# Patient Record
Sex: Female | Born: 1940 | Race: White | Hispanic: No | Marital: Single | Smoking: Never smoker
Health system: Southern US, Community
[De-identification: ages and names within clinical notes are randomized; demographics above are authoritative.]

## PROBLEM LIST (undated history)

## (undated) DIAGNOSIS — F79 Unspecified intellectual disabilities: Secondary | ICD-10-CM

## (undated) DIAGNOSIS — I82409 Acute embolism and thrombosis of unspecified deep veins of unspecified lower extremity: Secondary | ICD-10-CM

## (undated) DIAGNOSIS — E119 Type 2 diabetes mellitus without complications: Secondary | ICD-10-CM

## (undated) DIAGNOSIS — E7800 Pure hypercholesterolemia, unspecified: Secondary | ICD-10-CM

## (undated) DIAGNOSIS — E785 Hyperlipidemia, unspecified: Secondary | ICD-10-CM

## (undated) DIAGNOSIS — I10 Essential (primary) hypertension: Secondary | ICD-10-CM

## (undated) HISTORY — PX: ABDOMINAL HYSTERECTOMY: SHX81

## (undated) HISTORY — PX: FRACTURE SURGERY: SHX138

---

## 1997-06-25 ENCOUNTER — Encounter: Admission: RE | Admit: 1997-06-25 | Discharge: 1997-06-25 | Payer: Self-pay | Admitting: Family Medicine

## 1998-12-13 ENCOUNTER — Encounter: Admission: RE | Admit: 1998-12-13 | Discharge: 1998-12-13 | Payer: Self-pay | Admitting: Family Medicine

## 2000-01-20 ENCOUNTER — Encounter: Admission: RE | Admit: 2000-01-20 | Discharge: 2000-01-20 | Payer: Self-pay | Admitting: Family Medicine

## 2000-07-02 ENCOUNTER — Encounter: Admission: RE | Admit: 2000-07-02 | Discharge: 2000-07-02 | Payer: Self-pay | Admitting: Family Medicine

## 2000-09-07 ENCOUNTER — Encounter: Admission: RE | Admit: 2000-09-07 | Discharge: 2000-09-07 | Payer: Self-pay | Admitting: Sports Medicine

## 2000-11-22 ENCOUNTER — Encounter: Admission: RE | Admit: 2000-11-22 | Discharge: 2000-11-22 | Payer: Self-pay | Admitting: Family Medicine

## 2001-03-18 ENCOUNTER — Encounter (INDEPENDENT_AMBULATORY_CARE_PROVIDER_SITE_OTHER): Payer: Self-pay | Admitting: *Deleted

## 2001-03-18 LAB — CONVERTED CEMR LAB

## 2001-04-13 ENCOUNTER — Encounter: Admission: RE | Admit: 2001-04-13 | Discharge: 2001-04-13 | Payer: Self-pay | Admitting: Family Medicine

## 2001-09-05 ENCOUNTER — Encounter: Admission: RE | Admit: 2001-09-05 | Discharge: 2001-09-05 | Payer: Self-pay | Admitting: Family Medicine

## 2002-01-26 ENCOUNTER — Encounter: Admission: RE | Admit: 2002-01-26 | Discharge: 2002-01-26 | Payer: Self-pay | Admitting: Family Medicine

## 2002-01-27 ENCOUNTER — Encounter: Admission: RE | Admit: 2002-01-27 | Discharge: 2002-01-27 | Payer: Self-pay | Admitting: Family Medicine

## 2002-02-15 ENCOUNTER — Encounter: Admission: RE | Admit: 2002-02-15 | Discharge: 2002-02-15 | Payer: Self-pay | Admitting: Family Medicine

## 2002-09-04 ENCOUNTER — Encounter: Admission: RE | Admit: 2002-09-04 | Discharge: 2002-09-04 | Payer: Self-pay | Admitting: Family Medicine

## 2003-01-23 ENCOUNTER — Encounter: Admission: RE | Admit: 2003-01-23 | Discharge: 2003-01-23 | Payer: Self-pay | Admitting: Sports Medicine

## 2003-03-02 ENCOUNTER — Encounter: Admission: RE | Admit: 2003-03-02 | Discharge: 2003-03-02 | Payer: Self-pay | Admitting: Sports Medicine

## 2004-02-13 ENCOUNTER — Ambulatory Visit: Payer: Self-pay | Admitting: Family Medicine

## 2005-02-11 ENCOUNTER — Ambulatory Visit: Payer: Self-pay | Admitting: Sports Medicine

## 2006-01-20 ENCOUNTER — Ambulatory Visit: Payer: Self-pay | Admitting: Family Medicine

## 2006-05-13 DIAGNOSIS — I10 Essential (primary) hypertension: Secondary | ICD-10-CM

## 2006-05-13 DIAGNOSIS — F79 Unspecified intellectual disabilities: Secondary | ICD-10-CM | POA: Insufficient documentation

## 2006-05-13 DIAGNOSIS — E119 Type 2 diabetes mellitus without complications: Secondary | ICD-10-CM

## 2006-05-14 ENCOUNTER — Encounter (INDEPENDENT_AMBULATORY_CARE_PROVIDER_SITE_OTHER): Payer: Self-pay | Admitting: *Deleted

## 2007-02-08 ENCOUNTER — Ambulatory Visit: Payer: Self-pay | Admitting: Family Medicine

## 2007-12-29 ENCOUNTER — Ambulatory Visit: Payer: Self-pay | Admitting: Family Medicine

## 2007-12-29 LAB — CONVERTED CEMR LAB: Hgb A1c MFr Bld: 5.5 %

## 2008-03-26 ENCOUNTER — Telehealth (INDEPENDENT_AMBULATORY_CARE_PROVIDER_SITE_OTHER): Payer: Self-pay | Admitting: *Deleted

## 2008-06-20 ENCOUNTER — Ambulatory Visit: Payer: Self-pay | Admitting: Family Medicine

## 2008-06-20 LAB — CONVERTED CEMR LAB: Hgb A1c MFr Bld: 5.5 %

## 2009-02-20 ENCOUNTER — Ambulatory Visit: Payer: Self-pay | Admitting: Family Medicine

## 2009-02-20 ENCOUNTER — Encounter: Payer: Self-pay | Admitting: Sports Medicine

## 2009-02-20 DIAGNOSIS — B351 Tinea unguium: Secondary | ICD-10-CM

## 2009-02-20 LAB — CONVERTED CEMR LAB
ALT: 12 U/L (ref 0–35)
AST: 17 U/L (ref 0–37)
Albumin: 4.4 g/dL (ref 3.5–5.2)
Alkaline Phosphatase: 87 units/L (ref 39–117)
BUN: 16 mg/dL (ref 6–23)
CO2: 26 meq/L (ref 19–32)
Calcium: 9.8 mg/dL (ref 8.4–10.5)
Chloride: 103 meq/L (ref 96–112)
Creatinine, Ser: 0.78 mg/dL (ref 0.40–1.20)
Glucose, Bld: 123 mg/dL — ABNORMAL HIGH (ref 70–99)
Hgb A1c MFr Bld: 6 %
Potassium: 4.2 meq/L (ref 3.5–5.3)
Sodium: 143 meq/L (ref 135–145)
Total Bilirubin: 0.7 mg/dL (ref 0.3–1.2)
Total Protein: 7.3 g/dL (ref 6.0–8.3)

## 2009-02-22 ENCOUNTER — Telehealth: Payer: Self-pay | Admitting: Sports Medicine

## 2009-05-30 ENCOUNTER — Encounter: Payer: Self-pay | Admitting: Sports Medicine

## 2009-11-21 ENCOUNTER — Telehealth: Payer: Self-pay | Admitting: Sports Medicine

## 2009-11-28 ENCOUNTER — Ambulatory Visit: Payer: Self-pay | Admitting: Family Medicine

## 2009-12-03 ENCOUNTER — Encounter: Payer: Self-pay | Admitting: Sports Medicine

## 2010-01-08 ENCOUNTER — Encounter: Payer: Self-pay | Admitting: Sports Medicine

## 2010-04-15 NOTE — Progress Notes (Signed)
 Summary: Rx Req  Phone Note Refill Request Call back at Home Phone [phone] Message from:  Sister-Laura  Refills Requested: Medication #1:  HYDROCHLOROTHIAZIDE  25 MG TABS Take 1 tablet by mouth once a day  Medication #2:  GLUCOTROL  XL 5 MG TB24 Take 1 tablet by mouth once a day PT USES [facility] [place].  Initial call taken by: Madelin Daring,  February 22, 2009 3:50 PM  Follow-up for Phone Call        to pcp Follow-up by: Ginnie Mau RN,  February 22, 2009 4:07 PM  Additional Follow-up for Phone Call Additional follow up Details #1::        Refilled. Additional Follow-up by: Debby Petties MD,  February 24, 2009 8:51 AM    Prescriptions: GLUCOTROL  XL 5 MG TB24 (GLIPIZIDE ) Take 1 tablet by mouth once a day  #90 x 4   Entered and Authorized by:   Debby Petties MD   Signed by:   Debby Petties MD on 02/24/2009   Method used:   Electronically to        [facility] (retail)       [address]       [place], [place]  [address]       Ph: [phone]       Fax: [phone]   RxID:   [id number] HYDROCHLOROTHIAZIDE  25 MG TABS (HYDROCHLOROTHIAZIDE ) Take 1 tablet by mouth once a day  #90 x 4   Entered and Authorized by:   Debby Petties MD   Signed by:   Debby Petties MD on 02/24/2009   Method used:   Electronically to        [facility] (retail)       [address]       [place], [place]  [address]       Ph: [phone]       Fax: [phone]   RxID:   [id number]

## 2010-04-15 NOTE — Assessment & Plan Note (Signed)
 Summary: cpe/flu shot,tcb   Vital Signs:  Patient profile:   70 year old female Weight:      203 pounds Pulse rate:   85 / minute BP sitting:   133 / 76  Vitals Entered By: Harlene Carte CMA (February 20, 2009 3:01 PM) CC: cpe and flu Is Patient Diabetic? Yes Did you bring your meter with you today? No Pain Assessment Patient in pain? no        Primary Care Provider:  Debby Petties MD  CC:  cpe and flu.  History of Present Illness: 48f MR here for CPE.  Doing well, lives with brother.  Very well taken care of, walks short distances.  No falls but afraid of falling.  Family worried about toenail fungus.  No other worries.  Habits & Providers  Alcohol-Tobacco-Diet     Tobacco Status: never  Current Medications (verified): 1)  Altace  10 Mg Caps (Ramipril ) .... Take 1 Capsule By Mouth Twice A Day 2)  Aspirin  Childrens 81 Mg Chew (Aspirin ) .... Take 1 Tablet By Mouth Once A Day 3)  Calcium Citrate-Vitamin D  315-200 Mg-Unit Tabs (Calcium Citrate-Vitamin D ) .... Take 4)  Glucotrol  Xl 5 Mg Tb24 (Glipizide ) .... Take 1 Tablet By Mouth Once A Day 5)  Hydrochlorothiazide  25 Mg Tabs (Hydrochlorothiazide ) .... Take 1 Tablet By Mouth Once A Day 6)  Terbinafine Hcl 250 Mg Tabs (Terbinafine Hcl) .... One Tab By Mouth Daily X 12 Weeks  Allergies (verified): No Known Drug Allergies  Past History:  Past Medical History: Last updated: 12/29/2007 global speech and developmental delay  Past Surgical History: Last updated: 05/13/2006 Hysterectomy - Partial - 09/14/1978, tooth extraction - 09/13/1988  Family History: Last updated: 05/13/2006 DM- bro, grandfather, M-HTN, CAD, Osteoporosis  Social History: Last updated: 05/13/2006 lives with brother, Levander Generous [phone], and sister-in-law Jadin Kagel as well as with her mother.  Brother and sister-in-law are primary caretakers of both pt and her mother.  Brother with medical POA.  No smoking, etoh.  Pt able to communicate with  family but speech is unintelligible. Dresses herself, needs help with toileting. Ambulates at home w/ assistance.  Review of Systems       Unable, non-verbal.  Physical Exam  General:  Well-developed,well-nourished,in no acute distress; alert,appropriate and cooperative throughout examination Head:  Normocephalic and atraumatic without obvious abnormalities.  Eyes:  No corneal or conjunctival inflammation noted. EOMI. Perrla.  Ears:  External ear exam shows no significant lesions or deformities.  Otoscopic examination reveals canals occluded by impacted cerumen. Hearing is grossly normal bilaterally. Nose:  External nasal examination shows no deformity or inflammation. Nasal mucosa are pink and moist without lesions or exudates. Mouth:  Oral mucosa and oropharynx without lesions or exudates.  Edentulous. Neck:  No deformities, masses, or tenderness noted. Chest Wall:  No deformities, masses, or tenderness noted. Lungs:  Normal respiratory effort, chest expands symmetrically. Lungs are clear to auscultation, no crackles or wheezes. Heart:  Normal rate and regular rhythm. S1 and S2 normal without gallop, murmur, click, rub or other extra sounds. Abdomen:  Bowel sounds positive,abdomen soft and non-tender without masses, organomegaly or hernias noted. Pulses:  R and L carotid,radial,femoral,dorsalis pedis and posterior tibial pulses are full and equal bilaterally Extremities:  No clubbing, cyanosis, edema, or deformity noted with normal full range of motion of all joints.  Toenails thickened, yellowish. Skin:  Intact without suspicious lesions or rashes Additional Exam:  2 large 1cm impactions of earwax removed from left EAM.  Diabetes Management Exam:    Foot Exam (with socks and/or shoes not present):       Sensory-Monofilament:          Left foot: normal          Right foot: normal   Impression & Recommendations:  Problem # 1:  ONYCHOMYCOSIS, TOENAILS (ICD-110.1) Assessment  New Check LFTs, then Lamisil x 12 weeks.  Her updated medication list for this problem includes:    Terbinafine Hcl 250 Mg Tabs (Terbinafine hcl) ..... One tab by mouth daily x 12 weeks  Orders: [facility]- Est  Level 4 ([address]214) Comp Met-[facility] (19946-77099)  Problem # 2:  MENTAL RETARDATION (ICD-319) Assessment: Unchanged  Problem # 3:  HYPERTENSION, BENIGN SYSTEMIC (ICD-401.1) Assessment: Unchanged Ok, no changes needed at this time.  Her updated medication list for this problem includes:    Altace  10 Mg Caps (Ramipril ) .SABRA... Take 1 capsule by mouth twice a day    Hydrochlorothiazide  25 Mg Tabs (Hydrochlorothiazide ) .SABRA... Take 1 tablet by mouth once a day  Orders: [facility]- Est  Level 4 ([address]214) Comp Met-[facility] (19946-77099)  Problem # 4:  DIABETES MELLITUS II, UNCOMPLICATED (ICD-250.00) Assessment: Unchanged A1c 6%. Continue glucotrol , no changes, foot exam normal.  Her updated medication list for this problem includes:    Altace  10 Mg Caps (Ramipril ) .SABRA... Take 1 capsule by mouth twice a day    Aspirin  Childrens 81 Mg Chew (Aspirin ) .SABRA... Take 1 tablet by mouth once a day    Glucotrol  Xl 5 Mg Tb24 (Glipizide ) .SABRA... Take 1 tablet by mouth once a day  Orders: A1C-[facility] (16963) [facility]- Est  Level 4 (99214) Comp Met-[facility] ([id number])  Problem # 5:  Preventive Health Care (ICD-V70.0) Assessment: Unchanged CPE normal.  Family still refuses breast exam, PAP, mammograms.  RTC 1 year.  Gave flu shot.  Complete Medication List: 1)  Altace  10 Mg Caps (Ramipril ) .... Take 1 capsule by mouth twice a day 2)  Aspirin  Childrens 81 Mg Chew (Aspirin ) .... Take 1 tablet by mouth once a day 3)  Calcium Citrate-vitamin D  315-200 Mg-unit Tabs (Calcium citrate-vitamin d ) .... Take 4)  Glucotrol  Xl 5 Mg Tb24 (Glipizide ) .... Take 1 tablet by mouth once a day 5)  Hydrochlorothiazide  25 Mg Tabs (Hydrochlorothiazide ) .... Take 1 tablet by mouth once a day 6)  Terbinafine Hcl 250 Mg Tabs (Terbinafine hcl) .... One  tab by mouth daily x 12 weeks  Other Orders: Influenza Vaccine MCR (99974)  Patient Instructions: 1)  Great to meet ya'll today, 2)  Flu shot 3)  Already had tetanus and pneumonia shots. 4)  Check liver function. 5)  Start Lamisil (terbinafine) 250mg  daily x 12 weeks for toenail fungus. 6)  Flush ears 7)  Come back to see me in a year or sooner if any problems. 8)  -Dr. ONEIDA. Prescriptions: TERBINAFINE HCL 250 MG TABS (TERBINAFINE HCL) One tab by mouth daily x 12 weeks  #84 x 0   Entered and Authorized by:   Debby Petties MD   Signed by:   Debby Petties MD on 02/20/2009   Method used:   Electronically to        [facility] (retail)       [address]       [place], [place]  [address]       Ph: [phone]       Fax: [phone]   RxID:   [id number]  Laboratory Results   Blood Tests   Date/Time Received: February 20, 2009 2:52 PM  Date/Time Reported: February 20, 2009 3:18 PM   HGBA1C: 6.0%   (Normal Range: Non-Diabetic - 3-6%   Control Diabetic - 6-8%)  Comments: ...............test performed by......SABRABonnie A. Jordan, MLS (ASCP)cm      Last TD:  Td (06/20/2008 3:42:30 PM) TD Next Due:  10 yr    Diabetic Foot Exam Foot Inspection Is there a history of a foot ulcer?              No Is there a foot ulcer now?              No Is there swelling or an abnormal foot shape?          No Are the toenails long?                No Are the toenails thick?                No Are the toenails ingrown?              No Is there heavy callous build-up?              No Is there pain in the calf muscle (Intermittent claudication) when walking?    NoIs there a claw toe deformity?              No Is there elevated skin temperature?            No Is there limited ankle dorsiflexion?            No Is there foot or ankle muscle weakness?            No  Diabetic Foot Care Education Patient educated on appropriate care of diabetic  feet.  Pulse Check          Right Foot          Left Foot Posterior Tibial:        normal            normal Dorsalis Pedis:        normal            normal  High Risk Feet? No Set Next Diabetic Foot Exam here: 02/20/2010   10-g (5.07) Semmes-Weinstein Monofilament Test Performed by: Debby Petties MD          Right Foot          Left Foot Visual Inspection               Test Control      normal         normal Site 1         normal         normal Site 2         normal         normal Site 3         normal         normal Site 4         normal         normal Site 5         normal         normal Site 6         normal         normal Site 7         normal  normal Site 8         normal         normal Site 9         normal         normal Site 10         normal         normal  Impression      normal         normal   Prevention & Chronic Care Immunizations   Influenza vaccine: Fluvax MCR  (02/20/2009)   Influenza vaccine due: Not Indicated    Tetanus booster: 06/20/2008: Td   Tetanus booster due: 06/21/2018    Pneumococcal vaccine: Done.  (01/15/2000)   Pneumococcal vaccine due: None    H. zoster vaccine: Not documented  Colorectal Screening   Hemoccult: Done.  (03/18/2001)   Hemoccult due: Refused  (06/20/2008)    Colonoscopy: Not documented   Colonoscopy due: Refused  (06/20/2008)  Other Screening   Pap smear: Done.  (03/18/2001)   Pap smear due: Not Indicated    Mammogram: Done.  (03/16/2001)   Mammogram due: Refused  (06/20/2008)    DXA bone density scan: Not documented   DXA scan due: Refused  (06/20/2008)    Smoking status: never  (02/20/2009)  Diabetes Mellitus   HgbA1C: 6.0  (02/20/2009)   Hemoglobin A1C due: 03/30/2008    Eye exam: Not documented    Foot exam: yes  (02/20/2009)   Foot exam action/deferral: Do today   High risk foot: No  (02/20/2009)   Foot care education: Done  (02/20/2009)   Foot exam due: 02/20/2010    Urine  microalbumin/creatinine ratio: Not documented    Diabetes flowsheet reviewed?: Yes   Progress toward A1C goal: At goal  Lipids   Total Cholesterol: Not documented   LDL: Not documented   LDL Direct: Not documented   HDL: Not documented   Triglycerides: Not documented  Hypertension   Last Blood Pressure: 133 / 76  (02/20/2009)   Serum creatinine: Not documented   Serum potassium Not documented CMP ordered     Hypertension flowsheet reviewed?: Yes   Progress toward BP goal: At goal  Self-Management Support :   Personal Goals (by the next clinic visit) :     Personal A1C goal: 8  (02/20/2009)     Personal blood pressure goal: 130/80  (02/20/2009)     Personal LDL goal: 100  (02/20/2009)    Diabetes self-management support: Written self-care plan  (02/20/2009)   Diabetes care plan printed    Hypertension self-management support: Written self-care plan  (02/20/2009)   Hypertension self-care plan printed.   Nursing Instructions: Diabetic foot exam today    Immunizations Administered:  Influenza Vaccine # 1:    Vaccine Type: Fluvax MCR    Site: right deltoid    Mfr: GlaxoSmithKline    Dose: 0.5 ml    Route: IM    Given by: Harlene Carte CMA    Exp. Date: 09/12/2009    Lot #: [id number]    VIS given: 8.10.10  Flu Vaccine Consent Questions:    Do you have a history of severe allergic reactions to this vaccine? no    Any prior history of allergic reactions to egg and/or gelatin? no    Do you have a sensitivity to the preservative Thimersol? no    Do you have a past history of Guillan-Barre Syndrome? no    Do you currently have an acute febrile illness? no  Have you ever had a severe reaction to latex? no    Vaccine information given and explained to patient? yes    Are you currently pregnant? no

## 2010-04-15 NOTE — Assessment & Plan Note (Signed)
Summary: discuss living options/[place]   Vital Signs:  Patient profile:   70 year old female Temp:     98.2 degrees F Pulse rate:   87 / minute BP sitting:   125 / 78  Vitals Entered By: Jone Baseman CMA (November 28, 2009 4:59 PM) CC: discuss assisted living   Primary Care Provider:  Rodney Langton MD  CC:  discuss assisted living.  History of Present Illness: 70 yo female MR living with siblings.  Siblings are getting older, sister has had a back surgery, can no longer care for pt.  In to discuss placement options.  See geriatric assessment for further details.  I spent >30 minutes with this patient.  Current Medications (verified): 1)  Altace 10 Mg Caps (Ramipril) .... Take 1 Capsule By Mouth Twice A Day 2)  Aspirin Childrens 81 Mg Chew (Aspirin) .... Take 1 Tablet By Mouth Once A Day 3)  Calcium Citrate-Vitamin D 315-200 Mg-Unit Tabs (Calcium Citrate-Vitamin D) .... Take 4)  Glucotrol Xl 5 Mg Tb24 (Glipizide) .... Take 1 Tablet By Mouth Once A Day 5)  Hydrochlorothiazide 25 Mg Tabs (Hydrochlorothiazide) .... Take 1 Tablet By Mouth Once A Day 6)  Terbinafine Hcl 250 Mg Tabs (Terbinafine Hcl) .... One Tab By Mouth Daily X 12 Weeks  Allergies (verified): No Known Drug Allergies  Review of Systems       See HPI  Physical Exam  General:  Well-developed,well-nourished,in no acute distress; alert,appropriate and cooperative throughout examination.  Non-verbal. Lungs:  Normal respiratory effort, chest expands symmetrically. Lungs are clear to auscultation, no crackles or wheezes. Heart:  Normal rate and regular rhythm. S1 and S2 normal without gallop, murmur, click, rub or other extra sounds. Abdomen:  Bowel sounds positive,abdomen soft and non-tender without masses, organomegaly or hernias noted. Extremities:  No clubbing, cyanosis, edema, or deformity noted with normal full range of motion of all joints.     Impression & Recommendations:  Problem # 1:  MENTAL  RETARDATION (ICD-319) Assessment Unchanged Family is very appropriate, understandable that they can no longer care for pt.   Discussed with Dr. Sheffield Slider.   Pt completely dependent in ADLs and IADLs and non-verbal.   Ideal location will be nursing home.   Advised to visit several nursing homes, talk with social worker of the one they choose and they will send me the [facility] form to fill out.  Orders: [facility]- Est  Level 4 ([address])  Problem # 2:  HYPERTENSION, BENIGN SYSTEMIC (ICD-401.1) Assessment: Improved BP well controlled.  Her updated medication list for this problem includes:    Altace 10 Mg Caps (Ramipril) .Marland Kitchen... Take 1 capsule by mouth twice a day    Hydrochlorothiazide 25 Mg Tabs (Hydrochlorothiazide) .Marland Kitchen... Take 1 tablet by mouth once a day  Orders: [facility]- Est  Level 4 ([address]214)  Complete Medication List: 1)  Altace 10 Mg Caps (Ramipril) .... Take 1 capsule by mouth twice a day 2)  Aspirin Childrens 81 Mg Chew (Aspirin) .... Take 1 tablet by mouth once a day 3)  Calcium Citrate-vitamin D 315-200 Mg-unit Tabs (Calcium citrate-vitamin d) .... Take 4)  Glucotrol Xl 5 Mg Tb24 (Glipizide) .... Take 1 tablet by mouth once a day 5)  Hydrochlorothiazide 25 Mg Tabs (Hydrochlorothiazide) .... Take 1 tablet by mouth once a day 6)  Terbinafine Hcl 250 Mg Tabs (Terbinafine hcl) .... One tab by mouth daily x 12 weeks   Geriatric Assessment:  Activities of Daily Living:    Bathing-dependent  Dressing-dependent    Eating-independent    Toileting-assisted    Transferring-dependent    Continence-assisted  Instrumental Activities of Daily Living:    Transportation-dependent    Meal/Food Preparation-dependent    Shopping Errands-dependent    Housekeeping/Chores-dependent    Money Management/Finances-dependent    Medication Management-dependent    Ability to Use Telephone-dependent    Laundry-dependent

## 2010-04-15 NOTE — Letter (Signed)
Summary: Generic Letter  [facility]  [address]   [place], [place] [address]   Phone: [phone]  Fax: [phone]    05/30/2009  Crystal Robinson [address] [place], [place]  [address]  Dear Crystal Robinson,   You are overdue for some preventive medical tests.  Please make an appt to see me as soon as is convenient for you.     Sincerely,   Rodney Langton MD

## 2010-04-15 NOTE — Miscellaneous (Signed)
Summary: FL2  Patients brother dropped off FL-2 to be filled out.  Please mail to [facility]. Bradly Bienenstock  December 03, 2009 3:31 PM completed form to pcp chart box to add details & sign.Golden Circle RN  December 03, 2009 3:44 PM  Signed and in to-do box. Rodney Langton MD  December 04, 2009 9:10 AM

## 2010-04-15 NOTE — Miscellaneous (Signed)
Summary: FL2  pts brother dropped off form to be completed, gave to Terese Door for any clinical completion.  ERIN ODELL 01/08/10 3:53 PM    FL2 form completed and placed in Dr. Lucienne Minks box for signature. Terese Door  January 08, 2010 4:01 PM     Done and in  to-do box. Rodney Langton MD  January 09, 2010 10:19 AM

## 2010-04-15 NOTE — Progress Notes (Signed)
Summary: phn msg  Phone Note Call from Patient Call back at [phone]   Caller: brother- Ray Call For: Rodney Langton MD Summary of Call: [name], Crystal Robinson's brother would like for you to call him to discuss long term care for her.   Initial call taken by: Britta Mccreedy mcgregor  Follow-up for Phone Call        What specifit questions do they have? Follow-up by: Rodney Langton MD,  November 22, 2009 11:13 AM  Additional Follow-up for Phone Call Additional follow up Details #1::        pt is becoming more difficult to handle (they are getting older) - wants to know if doc could recommend a place for her to go to.  also was told that paperwork would need to be filled out for this, not sure if paperwork needs to be filled out before he looks for a place. Additional Follow-up by: De Nurse,  November 25, 2009 12:17 PM    Additional Follow-up for Phone Call Additional follow up Details #2::    This would be good to discuss in person to see if she would be ALF vs SNF status.  Will talk to them in the office visit re: ADLs and possibly even have a PT eval to determine functional status.  Have her come see me WITH necessary family PRESENT at visit.  May double, triple book, etc. Follow-up by: Rodney Langton MD,  November 25, 2009 2:15 PM  Additional Follow-up for Phone Call Additional follow up Details #3:: Details for Additional Follow-up Action Taken: her brother will have her here tomorrow to see Dr. Karie Schwalbe Additional Follow-up by: Golden Circle RN,  November 25, 2009 2:18 PM

## 2011-05-17 ENCOUNTER — Other Ambulatory Visit: Payer: Self-pay | Admitting: Sports Medicine

## 2011-05-17 NOTE — Telephone Encounter (Signed)
Refill request

## 2011-06-01 ENCOUNTER — Encounter: Payer: Self-pay | Admitting: Family Medicine

## 2011-06-24 ENCOUNTER — Telehealth: Payer: Self-pay | Admitting: Family Medicine

## 2011-06-24 ENCOUNTER — Encounter: Payer: Self-pay | Admitting: Family Medicine

## 2011-06-24 MED ORDER — HYDROCHLOROTHIAZIDE 25 MG PO TABS: 25.0000 mg | ORAL_TABLET | Freq: Every day | ORAL | Status: DC

## 2011-06-24 MED ORDER — GLIPIZIDE ER 5 MG PO TB24: 5.0000 mg | ORAL_TABLET | Freq: Every day | ORAL | Status: DC

## 2011-06-24 NOTE — Telephone Encounter (Signed)
Sister-in-law called to say that Ms. Armato was not able to keep appt today due to stomach virus and heavy diarrhea.  Will contact office to reschd later.  Need refills on her Glypizide and her Hctz sent to [facility] on [facility].

## 2011-07-01 ENCOUNTER — Ambulatory Visit (INDEPENDENT_AMBULATORY_CARE_PROVIDER_SITE_OTHER): Payer: Medicare Other | Admitting: Family Medicine

## 2011-07-01 ENCOUNTER — Encounter: Payer: Self-pay | Admitting: Family Medicine

## 2011-07-01 VITALS — BP 133/77 | HR 89 | Temp 97.5°F | Wt 197.0 lb

## 2011-07-01 DIAGNOSIS — F79 Unspecified intellectual disabilities: Secondary | ICD-10-CM

## 2011-07-01 DIAGNOSIS — E119 Type 2 diabetes mellitus without complications: Secondary | ICD-10-CM

## 2011-07-01 DIAGNOSIS — I10 Essential (primary) hypertension: Secondary | ICD-10-CM

## 2011-07-01 LAB — POCT GLYCOSYLATED HEMOGLOBIN (HGB A1C): Hemoglobin A1C: 5.3

## 2011-07-01 MED ORDER — RAMIPRIL 10 MG PO CAPS: 10.0000 mg | ORAL_CAPSULE | Freq: Every day | ORAL | Status: DC

## 2011-07-01 MED ORDER — HYDROCHLOROTHIAZIDE 25 MG PO TABS: 25.0000 mg | ORAL_TABLET | Freq: Every day | ORAL | Status: DC

## 2011-07-01 MED ORDER — GLIPIZIDE ER 5 MG PO TB24: 5.0000 mg | ORAL_TABLET | Freq: Every day | ORAL | Status: DC

## 2011-07-01 NOTE — Patient Instructions (Signed)
We are decreasing to 10 mg a day of Ramipril.   Check her blood pressure in about a week and let the nurse know what it is.   It should be less than 130/90.  Come back and see me in 3 months.

## 2011-07-02 LAB — CBC
MCH: 31.3 pg (ref 26.0–34.0)
MCHC: 33.4 g/dL (ref 30.0–36.0)
Platelets: 319 10*3/uL (ref 150–400)
RBC: 4.99 MIL/uL (ref 3.87–5.11)
RDW: 13.3 % (ref 11.5–15.5)

## 2011-07-02 LAB — COMPREHENSIVE METABOLIC PANEL
ALT: 17 U/L (ref 0–35)
Albumin: 4.1 g/dL (ref 3.5–5.2)
Alkaline Phosphatase: 78 U/L (ref 39–117)
Glucose, Bld: 160 mg/dL — ABNORMAL HIGH (ref 70–99)
Potassium: 4.3 mEq/L (ref 3.5–5.3)
Sodium: 142 mEq/L (ref 135–145)
Total Protein: 7 g/dL (ref 6.0–8.3)

## 2011-07-02 NOTE — Progress Notes (Signed)
  Subjective:    Patient ID: Crystal Robinson, female    DOB: [birth year], 71 y.o.   MRN: [id number]  HPI Patient here for complete physical exam. 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 She does live with her brother and his wife who are her caretakers. She has history of hypertension and diabetes mellitus x2.  Hypertension:  Long-term problem for this patient.  No adverse effects from medication.  Not checking it regularly.  No HA, CP, dizziness, shortness of breath, palpitations, or LE swelling.   BP Readings from Last 3 Encounters:  07/01/11 133/77  11/28/09 125/78  02/20/09 133/76    Diabetes:  Currently on Glipizide.    No adverse effects from medication.  No hypoglycemic events.  No paresthesia or peripheral nerve pain.  Measures blood sugars at home every:    Does not check at home. Lab Results  Component Value Date   HGBA1C 5.3 07/01/2011    The following portions of the patient's history were reviewed and updated as appropriate: allergies, current medications, past medical history, family and social history, and problem list.  Patient is a nonsmoker.   Review of Systems See HPI above for review of systems.       Objective:   Physical Exam Gen:  Alert, cooperative patient who appears stated age in no acute distress.  Vital signs reviewed. HEENT:  [place]/AT.  EOMI, PERRL.  MMM, tonsils non-erythematous, non-edematous.  External ears WNL, Bilateral TM's normal without retraction, redness or bulging.  Neck: No masses or thyromegaly or limitation in range of motion.  No cervical lymphadenopathy. Cardiac:  Regular rate and rhythm without murmur auscultated.  Good S1/S2. Pulm:  Clear to auscultation bilaterally with good air movement.  No wheezes or rales noted.   Abd:  Soft/nondistended/nontender.  Good bowel sounds throughout all four quadrants.  No masses noted.  Ext:  No edema.  She does hold left upper extremity and spasm which family states has been present since birth. Neuro:  Patient is nonverbal. She is pleasant and interactive. She does mobilize with wheelchair.       Assessment & Plan:

## 2011-07-02 NOTE — Assessment & Plan Note (Signed)
Stable. Family provide support at home.

## 2011-07-02 NOTE — Assessment & Plan Note (Signed)
Blood pressure at goal today. I do go be less than 1:30 systolic. Evidently she has been taking her Avapro on twice a day doses. Discuss with him that this is a once daily medication. Her basic decrease the medication to 10 mg a day. They're to recheck her blood pressure at home they're to call if they've noticed this value increasing. Also followup with [place] in about one month for followup blood pressure check.

## 2011-07-02 NOTE — Assessment & Plan Note (Signed)
Stable with A1c at goal. Continue current regimen of glipizide.

## 2011-07-03 ENCOUNTER — Telehealth: Payer: Self-pay | Admitting: Family Medicine

## 2011-07-03 MED ORDER — GLIPIZIDE ER 5 MG PO TB24: 5.0000 mg | ORAL_TABLET | Freq: Every day | ORAL | Status: DC

## 2011-07-03 NOTE — Telephone Encounter (Signed)
The refill for Glipizide did not make it to [facility] on [address].  Please resend it.

## 2011-07-03 NOTE — Telephone Encounter (Signed)
Done

## 2011-07-07 ENCOUNTER — Other Ambulatory Visit: Payer: Self-pay | Admitting: Family Medicine

## 2011-07-07 ENCOUNTER — Telehealth: Payer: Self-pay | Admitting: Family Medicine

## 2011-07-07 NOTE — Telephone Encounter (Signed)
Is having a problem getting the glipizide and the HCTZ from [facility] - they are telling her she cannot have it until sometime in May and she is almost out.  Needs our help to see what the problem is.

## 2011-07-07 NOTE — Telephone Encounter (Signed)
Spoke with [employer].  Patient received 90 day supply of HCTZ on 05/17/11 and is not due for another refill until 07/28/11 per insurance company.  Glipizide can be refilled on 07/18/11.  Patient is now taking Ramipril once a day and it was refilled x 2.  Vernona Rieger informed and states patient has enough meds to last until they can be refilled.  Will call our office back if she has any questions or problems.  Gaylene Brooks, RN

## 2011-08-19 ENCOUNTER — Telehealth: Payer: Self-pay | Admitting: Family Medicine

## 2011-08-19 NOTE — Telephone Encounter (Signed)
07/18/11 - 8:10 pm   124/77  08/05/11  -11:50 am   119/65  08/13/11  -10:35 am   132/72

## 2011-08-19 NOTE — Telephone Encounter (Signed)
Not sure what these are.  They look controlled however.  I am seeing her later this month, will discuss further then in person.

## 2011-09-02 ENCOUNTER — Encounter: Payer: Self-pay | Admitting: Family Medicine

## 2011-09-02 ENCOUNTER — Ambulatory Visit (INDEPENDENT_AMBULATORY_CARE_PROVIDER_SITE_OTHER): Payer: Medicare Other | Admitting: Family Medicine

## 2011-09-02 VITALS — BP 140/68 | HR 89 | Ht 65.0 in | Wt 200.0 lb

## 2011-09-02 DIAGNOSIS — F79 Unspecified intellectual disabilities: Secondary | ICD-10-CM

## 2011-09-02 DIAGNOSIS — I10 Essential (primary) hypertension: Secondary | ICD-10-CM

## 2011-09-02 MED ORDER — RAMIPRIL 10 MG PO CAPS: 10.0000 mg | ORAL_CAPSULE | Freq: Every day | ORAL | Status: DC

## 2011-09-02 NOTE — Patient Instructions (Addendum)
Crystal Robinson is doing very well today.  I'd like to see her back in about 6 months or so. Refill for Ramipri. New wheelchair prescription.

## 2011-09-04 NOTE — Assessment & Plan Note (Signed)
BLood pressure at goal today. Pleased that she is evidently doing much better on lower ACE than prior.   FU in 3 months for recheck.

## 2011-09-04 NOTE — Progress Notes (Signed)
  Subjective:    Patient ID: Crystal Robinson, female    DOB: [birth year], 71 y.o.   MRN: [id number]  HPI Crystal Robinson returns today for BP recheck.  We decreased her Ramipril, which she was taking twice daily.  BP is good today.  Caregivers note that Crystal Robinson is much more active now and has no further "slumped over" episodes in her wheelchair since decreasing this medication.  They had not really noticed before the extent of these episodes until they stopped.  She would slump over in her wheelchair and seem tired.  She would remain fully alert and awake during these episodes.  None for past 2 weeks, at least 1 daily prior to stopping Ramipril.   Review of Systems See HPI above for review of systems.       Objective:   Physical Exam Gen:  Alert, cooperative patient who appears stated age in no acute distress.  Vital signs reviewed. Cardiac:  Regular rate and rhythm without murmur auscultated.  Good S1/S2. Lungs:  Clear to auscultation bilaterally.  No wheezing, rales, rhonchi noted.    Ext:  No edema       Assessment & Plan:

## 2011-10-29 ENCOUNTER — Telehealth: Payer: Self-pay | Admitting: Family Medicine

## 2011-10-29 NOTE — Telephone Encounter (Signed)
[facility] is calling about a CMN for a wheelchair for this patient.  She said she faxed it over a month ago but I did not find it in Dr. Darius Bump or Paulina Fusi' boxes.  I have asked her to refax it and put it to Dr. Paulina Fusi attention.

## 2012-01-19 ENCOUNTER — Ambulatory Visit: Payer: Medicare Other | Admitting: Family Medicine

## 2012-01-22 ENCOUNTER — Encounter: Payer: Self-pay | Admitting: Family Medicine

## 2012-01-22 ENCOUNTER — Ambulatory Visit (INDEPENDENT_AMBULATORY_CARE_PROVIDER_SITE_OTHER): Payer: Medicare Other | Admitting: Family Medicine

## 2012-01-22 VITALS — BP 137/78 | HR 77 | Temp 97.7°F | Wt 204.0 lb

## 2012-01-22 DIAGNOSIS — I10 Essential (primary) hypertension: Secondary | ICD-10-CM

## 2012-01-22 DIAGNOSIS — E119 Type 2 diabetes mellitus without complications: Secondary | ICD-10-CM

## 2012-01-22 DIAGNOSIS — Z23 Encounter for immunization: Secondary | ICD-10-CM

## 2012-01-22 LAB — POCT GLYCOSYLATED HEMOGLOBIN (HGB A1C): Hemoglobin A1C: 6.1

## 2012-01-22 MED ORDER — GLIPIZIDE ER 5 MG PO TB24: 5.0000 mg | ORAL_TABLET | Freq: Every day | ORAL | Status: DC

## 2012-01-22 MED ORDER — HYDROCHLOROTHIAZIDE 25 MG PO TABS: 25.0000 mg | ORAL_TABLET | Freq: Every day | ORAL | Status: DC

## 2012-01-22 MED ORDER — RAMIPRIL 10 MG PO CAPS: 10.0000 mg | ORAL_CAPSULE | Freq: Every day | ORAL | Status: AC

## 2012-01-22 NOTE — Assessment & Plan Note (Signed)
BP well controlled on HCTZ and Ramipril.  Will see back in 6 months.  At that point will repeat BMET evaluate for creatinine (1 x per year)

## 2012-01-22 NOTE — Progress Notes (Signed)
Crystal Robinson is a 71 y.o. who presents today for f/u of her DM and HTN  HTN is well controlled on HCTZ and Ramipril.  Last BMP was stable.  No HA or vision changes.    DM well controlled on glucotrol XR 5 mg qd.  A1C today is 6.1.  Will get flu shot today.     History   Social History  . Marital Status: Single   Social History Main Topics  . Smoking status: Never Smoker     Current Outpatient Prescriptions on File Prior to Visit  Medication Sig Dispense Refill  . aspirin 81 MG chewable tablet Chew 81 mg by mouth daily.        . calcium citrate-vitamin D (CITRACAL+D) 315-200 MG-UNIT per tablet Take 1 tablet by mouth daily.        . [DISCONTINUED] glipiZIDE (GLUCOTROL XL) 5 MG 24 hr tablet Take 1 tablet (5 mg total) by mouth daily.  90 tablet  2  . [DISCONTINUED] hydrochlorothiazide (HYDRODIURIL) 25 MG tablet Take 1 tablet (25 mg total) by mouth daily.  90 tablet  2  . [DISCONTINUED] ramipril (ALTACE) 10 MG capsule Take 1 capsule (10 mg total) by mouth daily.  90 capsule  2    Physical Exam Filed Vitals:   01/22/12 1401  BP: 137/78  Pulse: 77  Temp: 97.7 F (36.5 C)    Gen: NAD,  HEENT: [place] impaction B/L, [place]/AT Neck: no JVD Cardio: RRR, no murmurs appreciated  Lungs: CTA Extremities: +2 Pulses B/L LE, Sensation intact B/L plantar aspect of feet.       Chemistry      Component Value Date/Time   NA 142 07/01/2011 1532   K 4.3 07/01/2011 1532   CL 103 07/01/2011 1532   CO2 26 07/01/2011 1532   BUN 13 07/01/2011 1532   CREATININE 0.64 07/01/2011 1532   CREATININE 0.78 02/20/2009 2010      Component Value Date/Time   CALCIUM 10.1 07/01/2011 1532   ALKPHOS 78 07/01/2011 1532   AST 23 07/01/2011 1532   ALT 17 07/01/2011 1532   BILITOT 0.6 07/01/2011 1532       Lab Results  Component Value Date   HGBA1C 6.1 01/22/2012

## 2012-01-22 NOTE — Assessment & Plan Note (Signed)
A1C at 6.1 today.  Will continue Glucotrol XR 5 mg qd.  Will recheck in 6 months.  Will also need eye exam along with UA at that time and recheck Lipids.

## 2012-01-22 NOTE — Patient Instructions (Signed)
Crystal Robinson, Lofty are doing well!  We will see you back in 6 months to check labs including your cholesterol and your diabetes.  Continue doing what you are doing.  Thank you, Dr. Paulina Fusi

## 2012-04-29 ENCOUNTER — Ambulatory Visit: Payer: Medicare Other | Admitting: Family Medicine

## 2012-09-22 ENCOUNTER — Other Ambulatory Visit: Payer: Self-pay

## 2013-07-19 ENCOUNTER — Telehealth: Payer: Self-pay | Admitting: *Deleted

## 2013-07-19 NOTE — Telephone Encounter (Signed)
Left message with woman for patient to call back and schedule an appointment for follow up of diabetes/cholesterol.

## 2014-03-28 ENCOUNTER — Encounter (HOSPITAL_COMMUNITY): Payer: Self-pay | Admitting: Emergency Medicine

## 2014-03-28 ENCOUNTER — Inpatient Hospital Stay (HOSPITAL_COMMUNITY)
Admission: EM | Admit: 2014-03-28 | Discharge: 2014-03-31 | DRG: 301 | Disposition: A | Discharge status: Home / Self Care | Payer: Medicare Other | Attending: Internal Medicine | Admitting: Internal Medicine

## 2014-03-28 DIAGNOSIS — E785 Hyperlipidemia, unspecified: Secondary | ICD-10-CM | POA: Diagnosis not present

## 2014-03-28 DIAGNOSIS — I82401 Acute embolism and thrombosis of unspecified deep veins of right lower extremity: Secondary | ICD-10-CM

## 2014-03-28 DIAGNOSIS — F79 Unspecified intellectual disabilities: Secondary | ICD-10-CM | POA: Diagnosis present

## 2014-03-28 DIAGNOSIS — I82431 Acute embolism and thrombosis of right popliteal vein: Secondary | ICD-10-CM | POA: Diagnosis present

## 2014-03-28 DIAGNOSIS — I82411 Acute embolism and thrombosis of right femoral vein: Principal | ICD-10-CM | POA: Diagnosis present

## 2014-03-28 DIAGNOSIS — I82419 Acute embolism and thrombosis of unspecified femoral vein: Secondary | ICD-10-CM | POA: Diagnosis present

## 2014-03-28 DIAGNOSIS — Z66 Do not resuscitate: Secondary | ICD-10-CM | POA: Diagnosis present

## 2014-03-28 DIAGNOSIS — E119 Type 2 diabetes mellitus without complications: Secondary | ICD-10-CM

## 2014-03-28 DIAGNOSIS — I824Y1 Acute embolism and thrombosis of unspecified deep veins of right proximal lower extremity: Secondary | ICD-10-CM | POA: Diagnosis present

## 2014-03-28 DIAGNOSIS — I743 Embolism and thrombosis of arteries of the lower extremities: Secondary | ICD-10-CM

## 2014-03-28 DIAGNOSIS — M7989 Other specified soft tissue disorders: Secondary | ICD-10-CM | POA: Diagnosis present

## 2014-03-28 DIAGNOSIS — I82441 Acute embolism and thrombosis of right tibial vein: Secondary | ICD-10-CM | POA: Diagnosis present

## 2014-03-28 DIAGNOSIS — I10 Essential (primary) hypertension: Secondary | ICD-10-CM | POA: Diagnosis not present

## 2014-03-28 DIAGNOSIS — I82409 Acute embolism and thrombosis of unspecified deep veins of unspecified lower extremity: Secondary | ICD-10-CM | POA: Diagnosis present

## 2014-03-28 HISTORY — DX: Hyperlipidemia, unspecified: E78.5

## 2014-03-28 HISTORY — DX: Unspecified intellectual disabilities: F79

## 2014-03-28 HISTORY — DX: Type 2 diabetes mellitus without complications: E11.9

## 2014-03-28 HISTORY — DX: Pure hypercholesterolemia, unspecified: E78.00

## 2014-03-28 HISTORY — DX: Essential (primary) hypertension: I10

## 2014-03-28 LAB — HEPATIC FUNCTION PANEL
ALT: 17 U/L (ref 0–35)
AST: 25 U/L (ref 0–37)
Albumin: 3.7 g/dL (ref 3.5–5.2)
Alkaline Phosphatase: 61 U/L (ref 39–117)
Bilirubin, Direct: 0.2 mg/dL (ref 0.0–0.3)
Indirect Bilirubin: 0.8 mg/dL (ref 0.3–0.9)
Total Bilirubin: 1 mg/dL (ref 0.3–1.2)
Total Protein: 7.2 g/dL (ref 6.0–8.3)

## 2014-03-28 LAB — CBC WITH DIFFERENTIAL/PLATELET
Basophils Absolute: 0.1 10*3/uL (ref 0.0–0.1)
Basophils Relative: 1 % (ref 0–1)
Eosinophils Absolute: 0.2 10*3/uL (ref 0.0–0.7)
Eosinophils Relative: 2 % (ref 0–5)
HCT: 44.9 % (ref 36.0–46.0)
HEMOGLOBIN: 15.1 g/dL — AB (ref 12.0–15.0)
Lymphocytes Relative: 19 % (ref 12–46)
Lymphs Abs: 1.6 10*3/uL (ref 0.7–4.0)
MCH: 30.9 pg (ref 26.0–34.0)
MCHC: 33.6 g/dL (ref 30.0–36.0)
MCV: 92 fL (ref 78.0–100.0)
MONOS PCT: 12 % (ref 3–12)
Monocytes Absolute: 1 10*3/uL (ref 0.1–1.0)
NEUTROS ABS: 5.8 10*3/uL (ref 1.7–7.7)
NEUTROS PCT: 66 % (ref 43–77)
Platelets: 260 10*3/uL (ref 150–400)
RBC: 4.88 MIL/uL (ref 3.87–5.11)
RDW: 12.7 % (ref 11.5–15.5)
WBC: 8.7 10*3/uL (ref 4.0–10.5)

## 2014-03-28 LAB — PROTIME-INR
INR: 1.09 (ref 0.00–1.49)
INR: 1.13 (ref 0.00–1.49)
PROTHROMBIN TIME: 14.2 s (ref 11.6–15.2)
Prothrombin Time: 14.6 seconds (ref 11.6–15.2)

## 2014-03-28 LAB — BASIC METABOLIC PANEL
Anion gap: 10 (ref 5–15)
BUN: 15 mg/dL (ref 6–23)
CALCIUM: 10.8 mg/dL — AB (ref 8.4–10.5)
CO2: 24 mmol/L (ref 19–32)
Chloride: 103 mEq/L (ref 96–112)
Creatinine, Ser: 0.69 mg/dL (ref 0.50–1.10)
GFR calc Af Amer: >90 mL/min (ref >90)
GFR calc non Af Amer: 86 mL/min — ABNORMAL LOW (ref >90)
GLUCOSE: 175 mg/dL — AB (ref 70–99)
Potassium: 3.7 mmol/L (ref 3.5–5.1)
Sodium: 137 mmol/L (ref 135–145)

## 2014-03-28 LAB — MAGNESIUM: Magnesium: 1.8 mg/dL (ref 1.5–2.5)

## 2014-03-28 LAB — APTT
aPTT: 33 seconds (ref 24–37)
aPTT: 37 seconds (ref 24–37)

## 2014-03-28 LAB — GLUCOSE, CAPILLARY: Glucose-Capillary: 151 mg/dL — ABNORMAL HIGH (ref 70–99)

## 2014-03-28 MED ORDER — VITAMIN D3 25 MCG (1000 UNIT) PO TABS: 1000.0000 [IU] | ORAL_TABLET | Freq: Every day | ORAL | Status: DC

## 2014-03-28 MED ORDER — VITAMIN C 500 MG PO TABS
500.0000 mg | ORAL_TABLET | Freq: Every day | ORAL | Status: DC
  Administered 2014-03-29 – 2014-03-31 (×3): 500 mg via ORAL
  Filled 2014-03-28 (×3): qty 1

## 2014-03-28 MED ORDER — ONDANSETRON HCL 4 MG PO TABS: 4.0000 mg | ORAL_TABLET | Freq: Four times a day (QID) | ORAL | Status: DC | PRN

## 2014-03-28 MED ORDER — ACETAMINOPHEN 325 MG PO TABS
650.0000 mg | ORAL_TABLET | Freq: Two times a day (BID) | ORAL | Status: DC | PRN
  Filled 2014-03-28 (×2): qty 2

## 2014-03-28 MED ORDER — HEPARIN (PORCINE) IN NACL 100-0.45 UNIT/ML-% IJ SOLN
1250.0000 [IU]/h | INTRAMUSCULAR | Status: DC
  Administered 2014-03-28: 1250 [IU]/h via INTRAVENOUS
  Filled 2014-03-28: qty 250

## 2014-03-28 MED ORDER — ALUM & MAG HYDROXIDE-SIMETH 200-200-20 MG/5ML PO SUSP: 30.0000 mL | Freq: Four times a day (QID) | ORAL | Status: DC | PRN

## 2014-03-28 MED ORDER — OXYCODONE HCL 5 MG PO TABS: 5.0000 mg | ORAL_TABLET | ORAL | Status: DC | PRN

## 2014-03-28 MED ORDER — HEPARIN SODIUM (PORCINE) 5000 UNIT/ML IJ SOLN: 60.0000 [IU]/kg | Freq: Once | INTRAMUSCULAR | Status: DC

## 2014-03-28 MED ORDER — ASPIRIN 81 MG PO CHEW
81.0000 mg | CHEWABLE_TABLET | Freq: Every day | ORAL | Status: DC
  Administered 2014-03-29 – 2014-03-31 (×3): 81 mg via ORAL
  Filled 2014-03-28 (×3): qty 1

## 2014-03-28 MED ORDER — FENOFIBRATE 160 MG PO TABS
160.0000 mg | ORAL_TABLET | Freq: Every day | ORAL | Status: DC
  Administered 2014-03-29 – 2014-03-31 (×3): 160 mg via ORAL
  Filled 2014-03-28 (×3): qty 1

## 2014-03-28 MED ORDER — SODIUM CHLORIDE 0.9 % IJ SOLN: 3.0000 mL | Freq: Two times a day (BID) | INTRAMUSCULAR | Status: DC

## 2014-03-28 MED ORDER — DIPHENHYDRAMINE-APAP (SLEEP) 25-500 MG PO TABS: 3.0000 | ORAL_TABLET | Freq: Every evening | ORAL | Status: DC | PRN

## 2014-03-28 MED ORDER — ACETAMINOPHEN 325 MG PO TABS
650.0000 mg | ORAL_TABLET | Freq: Four times a day (QID) | ORAL | Status: DC | PRN
  Administered 2014-03-30 (×2): 650 mg via ORAL

## 2014-03-28 MED ORDER — MORPHINE SULFATE 2 MG/ML IJ SOLN: 1.0000 mg | INTRAMUSCULAR | Status: DC | PRN

## 2014-03-28 MED ORDER — ONDANSETRON HCL 4 MG/2ML IJ SOLN: 4.0000 mg | Freq: Four times a day (QID) | INTRAMUSCULAR | Status: DC | PRN

## 2014-03-28 MED ORDER — ACETAMINOPHEN ER 650 MG PO TBCR: 1300.0000 mg | EXTENDED_RELEASE_TABLET | Freq: Every day | ORAL | Status: DC | PRN

## 2014-03-28 MED ORDER — ACETAMINOPHEN 500 MG PO TABS
500.0000 mg | ORAL_TABLET | Freq: Every evening | ORAL | Status: DC | PRN
  Filled 2014-03-28: qty 1

## 2014-03-28 MED ORDER — VITAMIN D3 25 MCG (1000 UNIT) PO TABS
2000.0000 [IU] | ORAL_TABLET | Freq: Every day | ORAL | Status: DC
  Administered 2014-03-29 – 2014-03-31 (×3): 2000 [IU] via ORAL
  Filled 2014-03-28 (×3): qty 2

## 2014-03-28 MED ORDER — ACETAMINOPHEN 650 MG RE SUPP
650.0000 mg | Freq: Four times a day (QID) | RECTAL | Status: DC | PRN
Start: 2014-03-28 — End: 2014-03-31

## 2014-03-28 MED ORDER — VITAMIN E 180 MG (400 UNIT) PO CAPS
400.0000 [IU] | ORAL_CAPSULE | Freq: Every day | ORAL | Status: DC
  Administered 2014-03-29 – 2014-03-31 (×3): 400 [IU] via ORAL
  Filled 2014-03-28 (×3): qty 1

## 2014-03-28 MED ORDER — DOCUSATE SODIUM 100 MG PO CAPS
100.0000 mg | ORAL_CAPSULE | Freq: Two times a day (BID) | ORAL | Status: DC
  Administered 2014-03-29 – 2014-03-31 (×5): 100 mg via ORAL
  Filled 2014-03-28 (×6): qty 1

## 2014-03-28 MED ORDER — BISACODYL 10 MG RE SUPP: 10.0000 mg | Freq: Every day | RECTAL | Status: DC | PRN

## 2014-03-28 MED ORDER — INSULIN ASPART 100 UNIT/ML ~~LOC~~ SOLN
0.0000 [IU] | Freq: Three times a day (TID) | SUBCUTANEOUS | Status: DC
  Administered 2014-03-29: 3 [IU] via SUBCUTANEOUS
  Administered 2014-03-29 – 2014-03-31 (×3): 2 [IU] via SUBCUTANEOUS
  Administered 2014-03-31: 3 [IU] via SUBCUTANEOUS

## 2014-03-28 MED ORDER — PANTOPRAZOLE SODIUM 40 MG PO TBEC
40.0000 mg | DELAYED_RELEASE_TABLET | Freq: Every day | ORAL | Status: DC
Start: 2014-03-29 — End: 2014-03-31
  Administered 2014-03-29 – 2014-03-31 (×3): 40 mg via ORAL
  Filled 2014-03-28 (×4): qty 1

## 2014-03-28 MED ORDER — VITAMIN D3 50 MCG (2000 UT) PO TABS: 1.0000 | ORAL_TABLET | Freq: Every day | ORAL | Status: DC

## 2014-03-28 MED ORDER — HEPARIN BOLUS VIA INFUSION
2200.0000 [IU] | INTRAVENOUS | Status: DC
  Filled 2014-03-28: qty 2200

## 2014-03-28 MED ORDER — ALBUTEROL SULFATE (2.5 MG/3ML) 0.083% IN NEBU: 2.5000 mg | INHALATION_SOLUTION | RESPIRATORY_TRACT | Status: DC | PRN

## 2014-03-28 MED ORDER — HYDROCHLOROTHIAZIDE 25 MG PO TABS
25.0000 mg | ORAL_TABLET | Freq: Every day | ORAL | Status: DC
  Administered 2014-03-29 – 2014-03-31 (×3): 25 mg via ORAL
  Filled 2014-03-28 (×3): qty 1

## 2014-03-28 MED ORDER — MAGNESIUM CITRATE PO SOLN: 1.0000 | Freq: Once | ORAL | Status: AC | PRN

## 2014-03-28 MED ORDER — ZOLPIDEM TARTRATE 5 MG PO TABS: 5.0000 mg | ORAL_TABLET | Freq: Every evening | ORAL | Status: DC | PRN

## 2014-03-28 MED ORDER — POLYETHYLENE GLYCOL 3350 17 G PO PACK: 17.0000 g | PACK | Freq: Every day | ORAL | Status: DC | PRN

## 2014-03-28 MED ORDER — DIPHENHYDRAMINE HCL 25 MG PO CAPS
25.0000 mg | ORAL_CAPSULE | Freq: Every evening | ORAL | Status: DC | PRN
  Administered 2014-03-30: 25 mg via ORAL
  Filled 2014-03-28 (×2): qty 1

## 2014-03-28 MED ORDER — SODIUM CHLORIDE 0.9 % IV SOLN
INTRAVENOUS | Status: DC
  Administered 2014-03-28 – 2014-03-29 (×2): via INTRAVENOUS

## 2014-03-28 MED ORDER — RAMIPRIL 10 MG PO CAPS
10.0000 mg | ORAL_CAPSULE | Freq: Every day | ORAL | Status: DC
Start: 2014-03-29 — End: 2014-03-31
  Administered 2014-03-29 – 2014-03-31 (×3): 10 mg via ORAL
  Filled 2014-03-28 (×3): qty 1

## 2014-03-28 NOTE — ED Notes (Signed)
Nurse notified of ready bed  And was told will call back but timer started, Jari FavreOscar told of possible bed change also...klj

## 2014-03-28 NOTE — Progress Notes (Signed)
*  PRELIMINARY RESULTS* Vascular Ultrasound Lower extremity venous duplex has been completed.  Preliminary findings: Right = Extensive, occlusive DVT of the common femoral, femoral, popliteal, posterior tibial, and peroneal veins. Also, superificial thrombosis of the greater saphenous and lesser saphenous veins. 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  No DVT noted in Left lower extremity.   Crystal DemarkJill Eunice, RDMS, RVT  03/28/2014, 4:15 PM

## 2014-03-28 NOTE — ED Notes (Signed)
US at bedside

## 2014-03-28 NOTE — Progress Notes (Signed)
EDCM consulted to see patient regarding home health services.  Patient presents to ED with right leg swelling and tenderness.  Bilateral lower extremity Dopplers were done that showed an extensive occlusive DVT of the common femoral, femoral, popliteal, posterior tibial and peroneal veins of the right lower extremity with also a superficial thrombosis of the greater saphenous and lesser saphenous veins.  EDCM spoke to patient's family at bedside.  Patient lives at home with her sister in law and her husband (patient's brother).  Patient currently does not have any home health services, and never has.  Patient's sister in law name Vernona RiegerLaura and her brother Rosalia HammersRay.  Ray's cell phone [phone] and home [phone].  Patient has a lift chair, wheelchair, transport chair and bedside commode at home.  Vernona RiegerLaura reports patient, "Doesn't deal with walkers at all."  Patient in nonambulatory per patient's sister in law.  Patient reports patient's pcp is Dr. Midge Miniumnsei Bonsu.  Vernona RiegerLaura reports Dr. Venida Jarvisnsei Bonsu ordered PT to come out to the house, "and it didn't work."  [facility] provided [facility]'s family with list of home health agencies [place].  EDCM explained with home health , the patient may receive a visiting RN, PT, OT, aide and Child psychotherapistsocial worker.  EDCM also provided patient's family with private duty nursing services.  Patient listed as having Medicare and Medicaid insurance.  Patient's family report, "We don't want to leave her unless it's absolutely necessary," when discussing placement.  Patient's family thankful for resources.  No further EDCM needs at this time.

## 2014-03-28 NOTE — ED Notes (Signed)
Bed: [id number] Expected date:  Expected time:  Means of arrival:  Comments: EMS leg pain

## 2014-03-28 NOTE — ED Provider Notes (Signed)
CSN: [id number]     Arrival date & time 03/28/14  1204 History   First MD Initiated Contact with Patient 03/28/14 1458     Chief Complaint  Patient presents with  . Leg Pain   Level V Caveat: MR   (Consider location/radiation/quality/duration/timing/severity/associated sxs/prior Treatment) HPI Crystal Robinson is a 74 y.o. female with history of MR, hypertension and diabetes comes in for evaluation for right leg redness and swelling. Patient is accompanied by family in the room who gives all the history of present illness. Sister -in-law states patient has had increased redness and swelling to her right lower leg for the past 2 or 3 days. She also reports patient has been more sedentary over the past 3 days. She has not noticed patient having increased work of breathing or any breathing difficulties. They report regular follow-up with their primary care, Dr. Julio Sickssei-Bonsu who they saw 3 months ago for a regular checkup. They have not given the patient anything for these symptoms. He denies fevers at home. No other modifying factors  Past Medical History  Diagnosis Date  . Mental retardation   . Diabetes mellitus without complication   . Hypertension   . Hypercholesterolemia    History reviewed. No pertinent past surgical history. History reviewed. No pertinent family history. History  Substance Use Topics  . Smoking status: Never Smoker   . Smokeless tobacco: Not on file  . Alcohol Use: No   OB History    No data available     Review of Systems  Unable to perform ROS     Allergies  Review of patient's allergies indicates no known allergies.  Home Medications   Prior to Admission medications   Medication Sig Start Date End Date Taking? Authorizing Provider  acetaminophen (TYLENOL) 650 MG CR tablet Take 1,300 mg by mouth daily as needed for pain (joint pain).   Yes Historical Provider, MD  aspirin 81 MG chewable tablet Chew 81 mg by mouth daily.     Yes Historical Provider, MD   Cholecalciferol (VITAMIN D3) 2000 UNITS TABS Take 1 tablet by mouth daily.   Yes Historical Provider, MD  diphenhydramine-acetaminophen (TYLENOL PM) 25-500 MG TABS Take 3 tablets by mouth at bedtime as needed (sleep).   Yes Historical Provider, MD  fenofibrate (TRICOR) 145 MG tablet Take 145 mg by mouth daily.   Yes Historical Provider, MD  Garlic 1000 MG CAPS Take 1,000 mg by mouth daily.   Yes Historical Provider, MD  glipiZIDE (GLUCOTROL XL) 5 MG 24 hr tablet Take 1 tablet (5 mg total) by mouth daily. 01/22/12  Yes Bryan R Hess, DO  hydrochlorothiazide (HYDRODIURIL) 25 MG tablet Take 1 tablet (25 mg total) by mouth daily. 01/22/12  Yes Bryan R Hess, DO  Multiple Vitamins-Minerals (MULTIVITAMIN & MINERAL PO) Take 1 tablet by mouth daily.   Yes Historical Provider, MD  ramipril (ALTACE) 10 MG capsule Take 1 capsule (10 mg total) by mouth daily. 01/22/12  Yes Bryan R Hess, DO  vitamin C (ASCORBIC ACID) 500 MG tablet Take 500 mg by mouth daily.   Yes Historical Provider, MD  vitamin E 400 UNIT capsule Take 400 Units by mouth daily.   Yes Historical Provider, MD   BP 114/93 mmHg  Pulse 105  Temp(Src) 98.8 F (37.1 C) (Axillary)  Resp 18  SpO2 98% Physical Exam  Constitutional: She is oriented to person, place, and time. She appears well-developed and well-nourished.  Patient with MR. He is alert and mentating at her baseline  per family.  HENT:  Head: Normocephalic and atraumatic.  Mouth/Throat: Oropharynx is clear and moist.  Eyes: Conjunctivae are normal. Pupils are equal, round, and reactive to light. Right eye exhibits no discharge. Left eye exhibits no discharge. No scleral icterus.  Neck: Normal range of motion. Neck supple.  Cardiovascular: Regular rhythm and normal heart sounds.   Mildly tachycardic  Pulmonary/Chest: Effort normal and breath sounds normal. No respiratory distress. She has no wheezes. She has no rales.  Abdominal: There is no tenderness.  Abdomen is firm. This is  patient's baseline per family.  Musculoskeletal: She exhibits no tenderness.  Right lower extremity appears mildly erythematous, edematous and tight. Patient does not endorse any wince or sign of pain upon palpation of the calf and deep venous system. Distal pulses intact. Left lower extremity is soft, nonerythematous and with varicosities. There are no open lesions or wounds.  Neurological: She is alert and oriented to person, place, and time.  Cranial Nerves II-XII grossly intact  Skin: Skin is warm and dry. No rash noted.  Psychiatric: She has a normal mood and affect.  Nursing note and vitals reviewed.   ED Course  Procedures (including critical care time) Labs Review Labs Reviewed  BASIC METABOLIC PANEL  CBC WITH DIFFERENTIAL    Imaging Review No results found.   EKG Interpretation None     Meds given in ED:  Medications - No data to display  New Prescriptions   No medications on file   Filed Vitals:   03/28/14 1204 03/28/14 1223  BP:  114/93  Pulse:  105  Temp:  98.8 F (37.1 C)  TempSrc:  Axillary  Resp:  18  SpO2: 100% 98%    MDM  Crystal Robinson is a 74 y.o. female with a history of MR, hypertension and diabetes comes in for evaluation of a 3 day history of leg swelling, redness. On exam patient has diffuse erythema to right lower extremity most prominently below the knee. There is edema and tightness to the calf and just proximal to the knee. There is no overt tenderness, patient does not wince or show any signs of pain with palpation. Posterior tibialis and dorsalis pedis with brisk pulses.   US shows significant DVT  Initiated heparin in the ED. Discussed case with attending, Dr. Littie Deeds. Plan to consult vascular surgery as well as admission to hospital.  Consult Vascular Surgery, Dr. Hart Rochester recommends anticoagulation, elevation and fitting for compression stocking. Will see in ED. Evaluated in ED and reports low concern for Phlegmasia Cerulea  Dolens.  Consult hospitalist, Dr. Janee Morn to see in ED Pt admitted. Final diagnoses:  DVT (deep venous thrombosis), right        Sharlene Motts, PA-C 03/29/14 1135  Mirian Mo, MD 03/30/14 [phone]

## 2014-03-28 NOTE — ED Notes (Signed)
Per EMS: Pt from home.  Pt w/ hx of MR.  Family states that they noticed pt's rt leg had been swelling and tender to the touch x 1 day.  Pt has no complaints.  Pt denies pain and palpation to pt's leg did not elicit a response.  Ambulated with assistance from chair to stretcher at baseline.

## 2014-03-28 NOTE — H&P (Signed)
[facility] History and Physical  Crystal Deteratricia Topete WUJ:[id number] DOB: 1940-05-18 DOA: 03/28/2014  Referring physician: Dr. Littie DeedsGentry PCP: Gildardo CrankerHess, Bryan, DO   Chief Complaint: Right leg swelling  HPI: Crystal Robinson is a 74 y.o. female  With history of mental retardation, hypertension, diabetes, hyperlipidemia who presents to the ED with a 1-2 day history of right lower extremity swelling and redness per family. Patient's brother and her sister-in-law at bedside and provide all the history as patient does have MR. Per brother patient is essentially nonambulatory however is able to do transfers and somewhat wheelchair-bound. Per brother and sister-in-law 1 day prior to admission it was noted that patient did have some right lower extremity swelling and tightness with some redness and some warmth to it. Though unable to determine whether it was painful for the patient. They do endorse that patient did have some generalized weakness/fatigue. Patient did not have any fevers, no chills, no nausea, no vomiting, no chest pain, no shortness of breath, no diarrhea, no constipation, no melena, no hematemesis, no hematochezia, no cough. They deny any recent surgeries. Patient was seen in the emergency room, basic metabolic profile which was done had a calcium of 10.8 otherwise was unremarkable. CBC done had a hemoglobin of 15.1 otherwise was unremarkable. Bilateral lower extremity Dopplers were done that showed an extensive occlusive DVT of the common femoral, femoral, popliteal, posterior tibial and peroneal veins of the right lower extremity with also a superficial thrombosis of the greater saphenous and lesser saphenous veins. Patient also noted to have some [place] and bluish discoloration of the right felt suggestive of phlegmasia cerulea dolens. ED PA stated he spoke with vascular surgery that had recommended IV heparin lower extremity elevation and fitting for compression stockings. [facility] were  called to admit the patient for further evaluation and management.   Review of Systems: As per history of present illness otherwise negative. Constitutional:  No weight loss, night sweats, Fevers, chills, fatigue.  HEENT:  No headaches, Difficulty swallowing,Tooth/dental problems,Sore throat,  No sneezing, itching, ear ache, nasal congestion, post nasal drip,  Cardio-vascular:  No chest pain, Orthopnea, PND, swelling in lower extremities, anasarca, dizziness, palpitations  GI:  No heartburn, indigestion, abdominal pain, nausea, vomiting, diarrhea, change in bowel habits, loss of appetite  Resp:  No shortness of breath with exertion or at rest. No excess mucus, no productive cough, No non-productive cough, No coughing up of blood.No change in color of mucus.No wheezing.No chest wall deformity  Skin:  no rash or lesions.  GU:  no dysuria, change in color of urine, no urgency or frequency. No flank pain.  Musculoskeletal:  No joint pain or swelling. No decreased range of motion. No back pain.  Psych:  No change in mood or affect. No depression or anxiety. No memory loss.   Past Medical History  Diagnosis Date  . Mental retardation   . Diabetes mellitus without complication   . Hypertension   . Hypercholesterolemia   . DM (diabetes mellitus), type 2 03/28/2014  . Hyperlipidemia 03/28/2014   History reviewed. No pertinent past surgical history. Social History:  reports that she has never smoked. She does not have any smokeless tobacco history on file. She reports that she does not drink alcohol or use illicit drugs.  No Known Allergies  History reviewed. No pertinent family history.   Prior to Admission medications   Medication Sig Start Date End Date Taking? Authorizing Provider  acetaminophen (TYLENOL) 650 MG CR tablet Take 1,300 mg by mouth daily  as needed for pain (joint pain).   Yes Historical Provider, MD  aspirin 81 MG chewable tablet Chew 81 mg by mouth daily.     Yes  Historical Provider, MD  Cholecalciferol (VITAMIN D3) 2000 UNITS TABS Take 1 tablet by mouth daily.   Yes Historical Provider, MD  diphenhydramine-acetaminophen (TYLENOL PM) 25-500 MG TABS Take 3 tablets by mouth at bedtime as needed (sleep).   Yes Historical Provider, MD  fenofibrate (TRICOR) 145 MG tablet Take 145 mg by mouth daily.   Yes Historical Provider, MD  Garlic 1000 MG CAPS Take 1,000 mg by mouth daily.   Yes Historical Provider, MD  glipiZIDE (GLUCOTROL XL) 5 MG 24 hr tablet Take 1 tablet (5 mg total) by mouth daily. 01/22/12  Yes Bryan R Hess, DO  hydrochlorothiazide (HYDRODIURIL) 25 MG tablet Take 1 tablet (25 mg total) by mouth daily. 01/22/12  Yes Bryan R Hess, DO  Multiple Vitamins-Minerals (MULTIVITAMIN & MINERAL PO) Take 1 tablet by mouth daily.   Yes Historical Provider, MD  ramipril (ALTACE) 10 MG capsule Take 1 capsule (10 mg total) by mouth daily. 01/22/12  Yes Bryan R Hess, DO  vitamin C (ASCORBIC ACID) 500 MG tablet Take 500 mg by mouth daily.   Yes Historical Provider, MD  vitamin E 400 UNIT capsule Take 400 Units by mouth daily.   Yes Historical Provider, MD   Physical Exam: Filed Vitals:   03/28/14 1204 03/28/14 1223 03/28/14 1552 03/28/14 1702  BP:  114/93 160/69   Pulse:  105 94   Temp:  98.8 F (37.1 C) 99.7 F (37.6 C)   TempSrc:  Axillary Rectal   Resp:  18 16   Height:     (1.676 m)  Weight:    90.719 kg (200 lb)  SpO2: 100% 98% 98%     Wt Readings from Last 3 Encounters:  03/28/14 90.719 kg (200 lb)  01/22/12 92.534 kg (204 lb)  09/02/11 90.719 kg (200 lb)    General:  Well-developed well-nourished laying on gurney in no acute cardiopulmonary distress.  Eyes: PERRLA, EOMI, normal lids, irises & conjunctiva ENT: grossly normal hearing, lips & tongue Neck: no LAD, masses or thyromegaly Cardiovascular: RRR, no m/r/g. Respiratory: CTA bilaterally anterior lung fields, no w/r/r. Normal respiratory effort. Abdomen: soft, ntnd, no rebound, no  guarding Skin: no rash or induration seen on limited exam Musculoskeletal: Right lower extremity is tight, some swelling, warmth. Right foot with 1-2+ dorsalis pedis pulses, cool to the touch, dusky looking with some mottling. Psychiatric: grossly normal mood and affect, speech fluent and appropriate Neurologic: Alert. Cranial nerves II through XII grossly intact. No focal deficits.           Labs on Admission:  Basic Metabolic Panel:  Recent Labs Lab 03/28/14 1556  NA 137  K 3.7  CL 103  CO2 24  GLUCOSE 175*  BUN 15  CREATININE 0.69  CALCIUM 10.8*   Liver Function Tests: No results for input(s): AST, ALT, ALKPHOS, BILITOT, PROT, ALBUMIN in the last 168 hours. No results for input(s): LIPASE, AMYLASE in the last 168 hours. No results for input(s): AMMONIA in the last 168 hours. CBC:  Recent Labs Lab 03/28/14 1556  WBC 8.7  NEUTROABS 5.8  HGB 15.1*  HCT 44.9  MCV 92.0  PLT 260   Cardiac Enzymes: No results for input(s): CKTOTAL, CKMB, CKMBINDEX, TROPONINI in the last 168 hours.  BNP (last 3 results) No results for input(s): PROBNP in the last 8760 hours.  CBG: No results for input(s): GLUCAP in the last 168 hours.  Radiological Exams on Admission: No results found.  EKG: None  Assessment/Plan Principal Problem:   Dvt femoral (deep venous thrombosis) Active Problems:   Mental retardation   HYPERTENSION, BENIGN SYSTEMIC   DM (diabetes mellitus), type 2   Hyperlipidemia   DVT (deep venous thrombosis)   #1 right extensive occlusive DVT Per Doppler ultrasound noted in the common femoral, femoral, popliteal, posterior tibial and peroneal veins. Also superficial thrombosis of the greater saphenous and lesser saphenous veins. Consent for phlegmasia cerulea dolens. Likely secondary to sedentary lifestyle and nonambulatory status. Patient with no recent surgeries. This is patient's first DVT. Will admit the patient to telemetry. Will check a hypercoagulable panel.  Will place patient on IV heparin, TED hose, elevate right lower extremity. Due to concern for phlegmasia cerulea dolens vascular surgery has been consulted for further evaluation and management.  #2 hypertension Stable. Continue home regimen of ramipril and HCTZ.  #3 type 2 diabetes mellitus Check a hemoglobin A1c. Place on a sliding scale insulin.  #4 hyperlipidemia Check a fasting lipid panel. Continue omega-3 fish oil.  #5 prophylaxis PPI for GI prophylaxis. Heparin for DVT prophylaxis.   Code Status: DO NOT RESUSCITATE DVT Prophylaxis: Heparin Family Communication: Updated patient, brother, sister-in-law, pastor at bedside. Disposition Plan: Admit to telemetry.  Time spent: 65 minutes  [facility] MD [facility] Pager [phone]

## 2014-03-28 NOTE — Consult Note (Signed)
Vascular Surgery Consultation  Reason for Consult: Rule out phlegmasia cerulea Dolens--inpatient with right leg DVT  HPI: Crystal Robinson is a 74 y.o. female who presents for evaluation of swollen right leg. Patient is mentally retarded and is cared for by her family who reports that her right leg became swollen 48 hours ago. Swelling extends from the mid thigh to the foot. She has no history of DVT. She has no history of pulmonary embolism. She does not elevate the legs. She is not ambulatory to any significant degree.   Past Medical History  Diagnosis Date  . Mental retardation   . Diabetes mellitus without complication   . Hypertension   . Hypercholesterolemia   . DM (diabetes mellitus), type 2 03/28/2014  . Hyperlipidemia 03/28/2014   Past Surgical History  Procedure Laterality Date  . Abdominal hysterectomy     History   Social History  . Marital Status: Single    Spouse Name: N/A    Number of Children: N/A  . Years of Education: N/A   Social History Main Topics  . Smoking status: Never Smoker   . Smokeless tobacco: Never Used  . Alcohol Use: No  . Drug Use: No  . Sexual Activity: None   Other Topics Concern  . None   Social History Narrative   History reviewed. No pertinent family history. No Known Allergies Prior to Admission medications   Medication Sig Start Date End Date Taking? Authorizing Provider  acetaminophen (TYLENOL) 650 MG CR tablet Take 1,300 mg by mouth daily as needed for pain (joint pain).   Yes Historical Provider, MD  aspirin 81 MG chewable tablet Chew 81 mg by mouth daily.     Yes Historical Provider, MD  Cholecalciferol (VITAMIN D3) 2000 UNITS TABS Take 1 tablet by mouth daily.   Yes Historical Provider, MD  diphenhydramine-acetaminophen (TYLENOL PM) 25-500 MG TABS Take 3 tablets by mouth at bedtime as needed (sleep).   Yes Historical Provider, MD  fenofibrate (TRICOR) 145 MG tablet Take 145 mg by mouth daily.   Yes Historical Provider, MD   Garlic 1000 MG CAPS Take 1,000 mg by mouth daily.   Yes Historical Provider, MD  glipiZIDE (GLUCOTROL XL) 5 MG 24 hr tablet Take 1 tablet (5 mg total) by mouth daily. 01/22/12  Yes Bryan R Hess, DO  hydrochlorothiazide (HYDRODIURIL) 25 MG tablet Take 1 tablet (25 mg total) by mouth daily. 01/22/12  Yes Bryan R Hess, DO  Multiple Vitamins-Minerals (MULTIVITAMIN & MINERAL PO) Take 1 tablet by mouth daily.   Yes Historical Provider, MD  ramipril (ALTACE) 10 MG capsule Take 1 capsule (10 mg total) by mouth daily. 01/22/12  Yes Bryan R Hess, DO  vitamin C (ASCORBIC ACID) 500 MG tablet Take 500 mg by mouth daily.   Yes Historical Provider, MD  vitamin E 400 UNIT capsule Take 400 Units by mouth daily.   Yes Historical Provider, MD     Positive ROS: Not obtainable from patient who is mentally retarded.  All other systems have been reviewed and were otherwise negative with the exception of those mentioned in the HPI and as above.  Physical Exam: Filed Vitals:   03/28/14 1552  BP: 160/69  Pulse: 94  Temp: 99.7 F (37.6 C)  Resp: 16    General: Alert, no acute distress HEENT: Normal for age Cardiovascular: Regular rate and rhythm. Carotid pulses 2+, no bruits audible Respiratory: Clear to auscultation. No cyanosis, no use of accessory musculature GI: No organomegaly, abdomen is soft  and non-tender-morbidly obese. Skin: No lesions in the area of chief complaint Neurologic: Patient is mentally retarded and does not respond to questioning. Psychiatric: Patient is competent for consent with normal mood and affect Musculoskeletal: No obvious deformities Extremities: Right leg is swollen from foot to proximal thigh. Foot is pink with some bluish discoloration of toes. Right calf moderately edematous 2+. 3+ posterior tibial and dorsalis pedis pulse easily palpable. Right femoral pulse 3 pelvis. Large panniculus. Left leg with 3+ femoral popliteal dorsalis pedis pulse palpable and no  edema.    Imaging reviewed: Patient has extensive DVT involving common femoral superficial femoral popliteal and tibial veins.   Assessment/Plan:  Extensive DVT right leg involving common femoral superficial femoral popliteal and tibial veins No evidence of phlegmasia cerulea Dolens. Patient has 3+ palpable arterial pulses in right foot  #1 elevate foot of bed 6 inches #2 patient needs IV heparin and transition to outpatient anticoagulation to be determined by hospitalist or medical doctor #3 should be fitted for a long-leg elastic compression stockings 20-30 millimeter gradient #4 would continue heparin for 3-6 months depending on how swelling progresses.  No further vascular evaluation indicated with evidence of normal arterial supply on physical exam   Josephina GipJames Tyan Lasure, MD 03/28/2014 6:05 PM

## 2014-03-28 NOTE — Progress Notes (Signed)
ANTICOAGULATION CONSULT NOTE - Initial Consult  Pharmacy Consult for Heparin Indication: DVT  No Known Allergies  Patient Measurements: Height:  (167.6 cm) Weight: 200 lb (90.719 kg) IBW/kg (Calculated) : 59.3 Heparin Dosing Weight: 79 kg  Vital Signs: Temp: 98.2 F (36.8 C) (01/13 1900) Temp Source: Oral (01/13 1900) BP: 102/52 mmHg (01/13 1900) Pulse Rate: 95 (01/13 1900)  Labs:  Recent Labs  03/28/14 1556 03/28/14 1703 03/28/14 2015  HGB 15.1*  --   --   HCT 44.9  --   --   PLT 260  --   --   APTT  --  33 37  LABPROT  --  14.2 14.6  INR  --  1.09 1.13  CREATININE 0.69  --   --     Estimated Creatinine Clearance: 73.2 mL/min (by C-G formula based on Cr of 0.69).   Medical History: Past Medical History  Diagnosis Date  . Mental retardation   . Diabetes mellitus without complication   . Hypertension   . Hypercholesterolemia   . DM (diabetes mellitus), type 2 03/28/2014  . Hyperlipidemia 03/28/2014    Medications:  Prescriptions prior to admission  Medication Sig Dispense Refill Last Dose  . acetaminophen (TYLENOL) 650 MG CR tablet Take 1,300 mg by mouth daily as needed for pain (joint pain).   03/28/2014 at Unknown time  . aspirin 81 MG chewable tablet Chew 81 mg by mouth daily.     03/28/2014 at Unknown time  . Cholecalciferol (VITAMIN D3) 2000 UNITS TABS Take 1 tablet by mouth daily.   03/28/2014 at Unknown time  . diphenhydramine-acetaminophen (TYLENOL PM) 25-500 MG TABS Take 3 tablets by mouth at bedtime as needed (sleep).   03/27/2014 at Unknown time  . fenofibrate (TRICOR) 145 MG tablet Take 145 mg by mouth daily.   03/28/2014 at Unknown time  . Garlic 1000 MG CAPS Take 1,000 mg by mouth daily.   03/28/2014 at Unknown time  . glipiZIDE (GLUCOTROL XL) 5 MG 24 hr tablet Take 1 tablet (5 mg total) by mouth daily. 90 tablet 2 03/28/2014 at Unknown time  . hydrochlorothiazide (HYDRODIURIL) 25 MG tablet Take 1 tablet (25 mg total) by mouth daily. 90 tablet 2  03/28/2014 at Unknown time  . Multiple Vitamins-Minerals (MULTIVITAMIN & MINERAL PO) Take 1 tablet by mouth daily.   03/28/2014 at Unknown time  . ramipril (ALTACE) 10 MG capsule Take 1 capsule (10 mg total) by mouth daily. 90 capsule 2 03/28/2014 at Unknown time  . vitamin C (ASCORBIC ACID) 500 MG tablet Take 500 mg by mouth daily.   03/28/2014 at Unknown time  . vitamin E 400 UNIT capsule Take 400 Units by mouth daily.   03/28/2014 at Unknown time   Infusions:  . sodium chloride 75 mL/hr at 03/28/14 1948  . heparin 1,250 Units/hr (03/28/14 1948)    Assessment: 73yoF admitted with 1-2 days swelling in RLE with redness.  Doppler shows extensive occlusive RLE DVT.  PMH: DM, HTN, HLD, mental retardation.  Pharmacy consulted to begin IV heparin.  No plans yet for PO anticoagulation.  Baseline CBC wnl Baseline aPTT, INR wnl No prior-to-admit anticoagulation  Goal of Therapy:  Heparin level 0.3-0.7 units/ml Monitor platelets by anticoagulation protocol: Yes   Plan:  Per [place] nomogram:  Heparin 2200 units IV bolus x 1 followed by  Heparin 1250 units/hr IV infusion  Check 8-hr heparin level tomorrow at 0100 and adjust as necessary  Daily CBC while on heparin  F/u signs of  bleeding or other complications of heparin  F/u plans for any long term anticoagulation   Bernadene Personrew Liyah Higham, PharmD Pager: [phone] 03/28/2014, 9:19 PM

## 2014-03-28 NOTE — ED Provider Notes (Signed)
Briefly, pt is a 74 y.o. female presenting with R le swelling and erythema, atraumatic x 1 day.  I performed an examination on the patient including cardiac, pulmonary, and gi systems which were unremarkable.  Her R LE has intact and equal pulses with left, however delayed cap refill, swelling through mid thigh.  Patient is nonambulatory.   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 Ultrasound obtained and confirmed large DVT. Consulted vascular and hospitalist for admission. Patient was placed on anticoagulation. Admitted in stable condition.     Mirian MoMatthew Makayla Confer, MD 03/28/14 [phone]

## 2014-03-29 LAB — BETA-2-GLYCOPROTEIN I ABS, IGG/M/A
Beta-2 Glyco I IgG: 14 G Units (ref <20)
Beta-2-Glycoprotein I IgA: 24 A Units — ABNORMAL HIGH (ref <20)
Beta-2-Glycoprotein I IgM: 9 M Units (ref <20)

## 2014-03-29 LAB — CBC
HCT: 41.3 % (ref 36.0–46.0)
Hemoglobin: 13.5 g/dL (ref 12.0–15.0)
MCH: 30.4 pg (ref 26.0–34.0)
MCHC: 32.7 g/dL (ref 30.0–36.0)
MCV: 93 fL (ref 78.0–100.0)
Platelets: 252 10*3/uL (ref 150–400)
RBC: 4.44 MIL/uL (ref 3.87–5.11)
RDW: 12.6 % (ref 11.5–15.5)
WBC: 8.4 10*3/uL (ref 4.0–10.5)

## 2014-03-29 LAB — BASIC METABOLIC PANEL
ANION GAP: 5 (ref 5–15)
BUN: 14 mg/dL (ref 6–23)
CHLORIDE: 105 meq/L (ref 96–112)
CO2: 28 mmol/L (ref 19–32)
Calcium: 10.3 mg/dL (ref 8.4–10.5)
Creatinine, Ser: 0.75 mg/dL (ref 0.50–1.10)
GFR calc non Af Amer: 83 mL/min — ABNORMAL LOW (ref >90)
GLUCOSE: 133 mg/dL — AB (ref 70–99)
Potassium: 4 mmol/L (ref 3.5–5.1)
Sodium: 138 mmol/L (ref 135–145)

## 2014-03-29 LAB — PROTIME-INR
INR: 1.1 (ref 0.00–1.49)
Prothrombin Time: 14.3 seconds (ref 11.6–15.2)

## 2014-03-29 LAB — GLUCOSE, CAPILLARY
Glucose-Capillary: 114 mg/dL — ABNORMAL HIGH (ref 70–99)
Glucose-Capillary: 159 mg/dL — ABNORMAL HIGH (ref 70–99)
Glucose-Capillary: 137 mg/dL — ABNORMAL HIGH (ref 70–99)
Glucose-Capillary: 137 mg/dL — ABNORMAL HIGH (ref 70–99)

## 2014-03-29 LAB — HEPARIN LEVEL (UNFRACTIONATED)
HEPARIN UNFRACTIONATED: 0.25 [IU]/mL — AB (ref 0.30–0.70)
Heparin Unfractionated: 0.57 IU/mL (ref 0.30–0.70)
Heparin Unfractionated: 0.71 IU/mL — ABNORMAL HIGH (ref 0.30–0.70)

## 2014-03-29 LAB — LIPID PANEL
CHOL/HDL RATIO: 3.7 ratio
CHOLESTEROL: 101 mg/dL (ref 0–200)
HDL: 27 mg/dL — ABNORMAL LOW (ref >39)
LDL CALC: 32 mg/dL (ref 0–99)
Triglycerides: 209 mg/dL — ABNORMAL HIGH (ref <150)
VLDL: 42 mg/dL — ABNORMAL HIGH (ref 0–40)

## 2014-03-29 LAB — HEMOGLOBIN A1C
HEMOGLOBIN A1C: 6.5 % — AB (ref <5.7)
MEAN PLASMA GLUCOSE: 140 mg/dL — AB (ref <117)

## 2014-03-29 LAB — LUPUS ANTICOAGULANT PANEL
DRVVT: 41.3 secs (ref <42.9)
LUPUS ANTICOAGULANT: NOT DETECTED
PTT LA: 50 s — AB (ref 28.0–43.0)
PTTLA 41 MIX: 46 s — AB (ref 28.0–43.0)
PTTLA Confirmation: 6 secs (ref <8.0)

## 2014-03-29 LAB — CARDIOLIPIN ANTIBODIES, IGG, IGM, IGA
ANTICARDIOLIPIN IGM: 0 [MPL'U]/mL — AB (ref <11)
Anticardiolipin IgA: 11 APL U/mL — ABNORMAL LOW (ref <22)
Anticardiolipin IgG: 5 GPL U/mL — ABNORMAL LOW (ref <23)

## 2014-03-29 LAB — ANTITHROMBIN III: AntiThromb III Func: 100 % (ref 75–120)

## 2014-03-29 MED ORDER — HEPARIN (PORCINE) IN NACL 100-0.45 UNIT/ML-% IJ SOLN
1450.0000 [IU]/h | INTRAMUSCULAR | Status: DC
  Administered 2014-03-29 (×2): 1450 [IU]/h via INTRAVENOUS
  Filled 2014-03-29 (×2): qty 250

## 2014-03-29 MED ORDER — HEPARIN BOLUS VIA INFUSION
2000.0000 [IU] | INTRAVENOUS | Status: AC
  Administered 2014-03-29 (×2): 2000 [IU] via INTRAVENOUS
  Filled 2014-03-29: qty 2000

## 2014-03-29 MED ORDER — HEPARIN (PORCINE) IN NACL 100-0.45 UNIT/ML-% IJ SOLN
1150.0000 [IU]/h | INTRAMUSCULAR | Status: DC
  Filled 2014-03-29 (×2): qty 250

## 2014-03-29 NOTE — Progress Notes (Addendum)
OT Cancellation Note  Patient Details Name: Crystal Robinson MRN: [id number] DOB: 10/17/1940   Cancelled Treatment:    Reason Eval/Treat Not Completed: OT screened, no needs identified, will sign off. Spoke to pt's family member that cares for pt and he and other family members assist pt with all of her ADL. They state they feel comfortable with how to assist pt and decline needing OT at this time. Will sign off.   Lennox LaityStone, Crystal Robinson  [phone]  03/29/2014, 12:04 PM

## 2014-03-29 NOTE — Progress Notes (Signed)
PT Cancellation Note / Screen  Patient Details Name: Crystal Robinson MRN: [id number] DOB: 05-14-1940   Cancelled Treatment:    Reason Eval/Treat Not Completed: PT screened, no needs identified, will sign off Spoke with pt's brother at bedside, pt lives with him and his wife.  He states they can assist her with transfers and she stopped ambulating a while ago due to her cognition.  He feels she is not likely to participate and states a HHPT has come to the house and observed his transfers with pt and reported no further needs.  Pt does not appear to be appropriate for PT at this time and pt's brother states he has no difficulty caring for pt at home.  PT to sign off.   Najmah Carradine,KATHrine E 03/29/2014, 9:27 AM Zenovia JarredKati Leeya Rusconi, PT, DPT 03/29/2014 Pager: [phone]

## 2014-03-29 NOTE — Progress Notes (Signed)
ANTICOAGULATION CONSULT NOTE - F/u Consult  Pharmacy Consult for Heparin Indication: DVT  No Known Allergies  Patient Measurements: Height: 5\' 6"  (167.6 cm) Weight: 200 lb (90.719 kg) IBW/kg (Calculated) : 59.3 Heparin Dosing Weight: 79 kg  Vital Signs: Temp: 97.5 F (36.4 C) (01/13 2155) Temp Source: Oral (01/13 2155) BP: 153/78 mmHg (01/13 2155) Pulse Rate: 85 (01/13 2155)  Labs:  Recent Labs  03/28/14 1556 03/28/14 1703 03/28/14 2015 03/29/14 0025  HGB 15.1*  --   --  13.5  HCT 44.9  --   --  41.3  PLT 260  --   --  252  APTT  --  33 37  --   LABPROT  --  14.2 14.6  --   INR  --  1.09 1.13  --   HEPARINUNFRC  --   --   --  0.25*  CREATININE 0.69  --   --  0.75    Estimated Creatinine Clearance: 73.2 mL/min (by C-G formula based on Cr of 0.75).   Medical History: Past Medical History  Diagnosis Date  . Mental retardation   . Diabetes mellitus without complication   . Hypertension   . Hypercholesterolemia   . DM (diabetes mellitus), type 2 03/28/2014  . Hyperlipidemia 03/28/2014    Medications:  Prescriptions prior to admission  Medication Sig Dispense Refill Last Dose  . acetaminophen (TYLENOL) 650 MG CR tablet Take 1,300 mg by mouth daily as needed for pain (joint pain).   03/28/2014 at Unknown time  . aspirin 81 MG chewable tablet Chew 81 mg by mouth daily.     03/28/2014 at Unknown time  . Cholecalciferol (VITAMIN D3) 2000 UNITS TABS Take 1 tablet by mouth daily.   03/28/2014 at Unknown time  . diphenhydramine-acetaminophen (TYLENOL PM) 25-500 MG TABS Take 3 tablets by mouth at bedtime as needed (sleep).   03/27/2014 at Unknown time  . fenofibrate (TRICOR) 145 MG tablet Take 145 mg by mouth daily.   03/28/2014 at Unknown time  . Garlic 1000 MG CAPS Take 1,000 mg by mouth daily.   03/28/2014 at Unknown time  . glipiZIDE (GLUCOTROL XL) 5 MG 24 hr tablet Take 1 tablet (5 mg total) by mouth daily. 90 tablet 2 03/28/2014 at Unknown time  . hydrochlorothiazide  (HYDRODIURIL) 25 MG tablet Take 1 tablet (25 mg total) by mouth daily. 90 tablet 2 03/28/2014 at Unknown time  . Multiple Vitamins-Minerals (MULTIVITAMIN & MINERAL PO) Take 1 tablet by mouth daily.   03/28/2014 at Unknown time  . ramipril (ALTACE) 10 MG capsule Take 1 capsule (10 mg total) by mouth daily. 90 capsule 2 03/28/2014 at Unknown time  . vitamin C (ASCORBIC ACID) 500 MG tablet Take 500 mg by mouth daily.   03/28/2014 at Unknown time  . vitamin E 400 UNIT capsule Take 400 Units by mouth daily.   03/28/2014 at Unknown time   Infusions:  . sodium chloride 75 mL/hr at 03/28/14 1948  . heparin 1,450 Units/hr (03/29/14 0128)    Assessment: 73yoF admitted with 1-2 days swelling in RLE with redness.  Doppler shows extensive occlusive RLE DVT.  PMH: DM, HTN, HLD, mental retardation.  Pharmacy consulted to begin IV heparin.  No plans yet for PO anticoagulation.  Baseline CBC wnl Baseline aPTT, INR wnl No prior-to-admit anticoagulation 1/14  0030 HL=0.25 after 1250 units/hr (bolus not charted?).  No problems per RN.  Goal of Therapy:  Heparin level 0.3-0.7 units/ml Monitor platelets by anticoagulation protocol: Yes   Plan:  Rebolus with 2000 units heparin  Increase heparin drip to 1450 units/hr  Recheck HL in 8 hours  Daily CBC while on heparin  F/u signs of bleeding or other complications of heparin  F/u plans for any long term anticoagulation   Crystal Robinson 03/29/2014, 1:30 AM

## 2014-03-29 NOTE — Progress Notes (Signed)
Patient with family and care givers this evening.  Patient urinated on bedpan.  Caregiver to stay in room with patient tonight.

## 2014-03-29 NOTE — Progress Notes (Signed)
[facility] PROGRESS NOTE  Crystal Robinson ZOX:[id number] DOB: 11/06/1940 DOA: 03/28/2014 PCP: Jackie Plum, MD  Assessment/Plan: #1 extensive occlusive DVT of, femoral/femoral/popliteal/posterior tibial/peroneal veins Likely secondary to his sedentary and immobility/nonambulatory status. Due to concerns for phlegmasia cerulea dolens patient was seen by vascular surgery who who felt there was no evidence of phlegmasia cerulea dolens. Continue IV heparin and transition to oral anticoagulation likely NOAC in the next 1-2 days. Compression stockings. Patient will likely need 6-9 months of anticoagulation. Outpatient follow-up.  #2 hypertension Stable. Continue home regimen of ramipril and HCTZ.  #3 well-controlled type 2 diabetes Hemoglobin A1c 6.5. CBGs have ranged from 114-159. Continue sliding scale insulin.  #4 hyperlipidemia LDL at goal of 32. Continue statin.  #5 prophylaxis Protonix for GI prophylaxis. Heparin for DVT.  Code Status: DO NOT RESUSCITATE Family Communication: Updated brother at bedside. Disposition Plan: Home when medically stable.   Consultants:  Vascular surgery: Dr. Hart Rochester 03/28/2014  Procedures:  Bilateral lower extremity Dopplers 03/28/2014  Antibiotics:  None  HPI/Subjective: Patient with no complaints.  Objective: Filed Vitals:   03/29/14 0541  BP: 144/123  Pulse: 81  Temp: 97.9 F (36.6 C)  Resp: 16    Intake/Output Summary (Last 24 hours) at 03/29/14 1103 Last data filed at 03/29/14 0700  Gross per 24 hour  Intake 991.07 ml  Output      0 ml  Net 991.07 ml   Filed Weights   03/28/14 1702 03/29/14 0541  Weight: 90.719 kg (200 lb) 85.1 kg (187 lb 9.8 oz)    Exam:   General:  NAD  Cardiovascular: RRR  Respiratory: CTAB anterior lung fields  Abdomen: Soft, nontender, nondistended, positive bowel sounds.  Musculoskeletal: No clubbing cyanosis. Right lower extremity less swollen, less tender to palpation, left foot  is warm with pulses noted.  Data Reviewed: Basic Metabolic Panel:  Recent Labs Lab 03/28/14 1556 03/28/14 2015 03/29/14 0025  NA 137  --  138  K 3.7  --  4.0  CL 103  --  105  CO2 24  --  28  GLUCOSE 175*  --  133*  BUN 15  --  14  CREATININE 0.69  --  0.75  CALCIUM 10.8*  --  10.3  MG  --  1.8  --    Liver Function Tests:  Recent Labs Lab 03/28/14 2015  AST 25  ALT 17  ALKPHOS 61  BILITOT 1.0  PROT 7.2  ALBUMIN 3.7   No results for input(s): LIPASE, AMYLASE in the last 168 hours. No results for input(s): AMMONIA in the last 168 hours. CBC:  Recent Labs Lab 03/28/14 1556 03/29/14 0025  WBC 8.7 8.4  NEUTROABS 5.8  --   HGB 15.1* 13.5  HCT 44.9 41.3  MCV 92.0 93.0  PLT 260 252   Cardiac Enzymes: No results for input(s): CKTOTAL, CKMB, CKMBINDEX, TROPONINI in the last 168 hours. BNP (last 3 results) No results for input(s): PROBNP in the last 8760 hours. CBG:  Recent Labs Lab 03/28/14 2154 03/29/14 0727  GLUCAP 151* 114*    No results found for this or any previous visit (from the past 240 hour(s)).   Studies: No results found.  Scheduled Meds: . aspirin  81 mg Oral Daily  . cholecalciferol  2,000 Units Oral Daily  . docusate sodium  100 mg Oral BID  . fenofibrate  160 mg Oral Daily  . hydrochlorothiazide  25 mg Oral Daily  . insulin aspart  0-15 Units Subcutaneous TID WC  .  pantoprazole  40 mg Oral Q0600  . ramipril  10 mg Oral Daily  . sodium chloride  3 mL Intravenous Q12H  . vitamin C  500 mg Oral Daily  . vitamin E  400 Units Oral Daily   Continuous Infusions: . sodium chloride 75 mL/hr at 03/29/14 0826  . heparin 1,450 Units/hr (03/29/14 0128)    Principal Problem:   Dvt femoral (deep venous thrombosis) Active Problems:   Mental retardation   HYPERTENSION, BENIGN SYSTEMIC   DM (diabetes mellitus), type 2   Hyperlipidemia   DVT (deep venous thrombosis)    Time spent: 35 minutes    [facility] Center[facility] IowaHOMPSON,Patrena Santalucia MD [facility] Pager [phone]. If 7PM-7AM, please contact night-coverage at [email or web address], password [email or web address] 03/29/2014, 11:03 AM  LOS: 1 day

## 2014-03-29 NOTE — Progress Notes (Signed)
ANTICOAGULATION CONSULT NOTE - Follow Up  Pharmacy Consult for Heparin Indication: DVT  No Known Allergies  Patient Measurements: Height: 5\' 6"  (167.6 cm) Weight: 187 lb 9.8 oz (85.1 kg) IBW/kg (Calculated) : 59.3 Heparin Dosing Weight: 77.4 kg  Vital Signs: Temp: 97.8 F (36.6 C) (01/14 1500) Temp Source: Oral (01/14 1500) BP: 107/62 mmHg (01/14 1500) Pulse Rate: 73 (01/14 1500)  Labs:  Recent Labs  03/28/14 1556 03/28/14 1703 03/28/14 2015 03/29/14 0025 03/29/14 1005 03/29/14 1800  HGB 15.1*  --   --  13.5  --   --   HCT 44.9  --   --  41.3  --   --   PLT 260  --   --  252  --   --   APTT  --  33 37  --   --   --   LABPROT  --  14.2 14.6 14.3  --   --   INR  --  1.09 1.13 1.10  --   --   HEPARINUNFRC  --   --   --  0.25* 0.57 0.71*  CREATININE 0.69  --   --  0.75  --   --     Estimated Creatinine Clearance: 70.9 mL/min (by C-G formula based on Cr of 0.75).   Medications:  Infusions:  . sodium chloride 75 mL/hr at 03/29/14 0826  . heparin 1,450 Units/hr (03/29/14 1157)    Assessment: [age] yoF admitted on 1/13 with RLE swelling and redness x1-2 days.  She is mostly non-ambulatory d/t MR.  PMH also includes HTN, HLD, and DM.  Doppler shows extensive occlusive right DVT.  Pharmacy is consulted to dose Heparin IV.  Today, 03/29/2014  Heparin level 0.71, continues to trend up and is now slightly supratherapeutic on heparin 14.5 ml/hr. No bleeding or infusion complications reported.  Goal of Therapy:  Heparin level 0.3-0.7 units/ml Monitor platelets by anticoagulation protocol: Yes   Plan:   Reduce heparin to 1150 units/hr (11.5 ml/hr).  Recheck heparin level in ~8 hours w/ am labs.  Daily heparin level and CBC.  Continue to monitor H&H and platelets.  Follow up plan for long-term anticoagulation.  Charolotte Ekeom Oryan Winterton, PharmD, pager [phone]. 03/29/2014,7:25 PM.

## 2014-03-29 NOTE — Progress Notes (Signed)
ANTICOAGULATION CONSULT NOTE - Follow Up Consult  Pharmacy Consult for Heparin Indication: DVT  No Known Allergies  Patient Measurements: Height: 5\' 6"  (167.6 cm) Weight: 187 lb 9.8 oz (85.1 kg) IBW/kg (Calculated) : 59.3 Heparin Dosing Weight: 77.4 kg  Vital Signs: Temp: 97.9 F (36.6 C) (01/14 0541) Temp Source: Oral (01/14 0541) BP: 144/123 mmHg (01/14 0541) Pulse Rate: 81 (01/14 0541)  Labs:  Recent Labs  03/28/14 1556 03/28/14 1703 03/28/14 2015 03/29/14 0025  HGB 15.1*  --   --  13.5  HCT 44.9  --   --  41.3  PLT 260  --   --  252  APTT  --  33 37  --   LABPROT  --  14.2 14.6 14.3  INR  --  1.09 1.13 1.10  HEPARINUNFRC  --   --   --  0.25*  CREATININE 0.69  --   --  0.75    Estimated Creatinine Clearance: 70.9 mL/min (by C-G formula based on Cr of 0.75).   Medications:  Infusions:  . sodium chloride 75 mL/hr at 03/29/14 0826  . heparin 1,450 Units/hr (03/29/14 0128)    Assessment: [age] yoF admitted on 1/13 with RLE swelling and redness x1-2 days.  She is mostly non-ambulatory d/t MR.  PMH also includes HTN, HLD, and DM.  Doppler shows extensive occlusive right DVT.  Pharmacy is consulted to dose Heparin IV.  Today, 03/29/2014  Heparin level 0.57, therapeutic on heparin 14.5 ml/hr  CBC: Hgb and Plt remain WNL.  No bleeding or infusion complications reported.  SCr 0.74, CrCl ~ 70 ml/min.    Goal of Therapy:  Heparin level 0.3-0.7 units/ml Monitor platelets by anticoagulation protocol: Yes   Plan:   Continue heparin IV infusion at 1450 units/hr (14.5 ml/hr)  Heparin level 8 hours after rate change  Daily heparin level and CBC  Continue to monitor H&H and platelets  Follow up plan for long-term anticoagulation   Lynann Beaverhristine Courvoisier Hamblen PharmD, BCPS Pager [phone] 03/29/2014 9:11 AM

## 2014-03-29 NOTE — Progress Notes (Signed)
After attempting to find someone to fit pt for compression stockings with no success,not done inpatient. Spoke with Vascular MD on call. Fitted stockings to be done outpatient per Vascular. Spoke with Crystal Robinson and will apply TED hose stocked on unit. Julio SicksK. Elexa Kivi RN

## 2014-03-29 NOTE — Care Management Note (Addendum)
    Page 1 of 1   03/30/2014     3:19:08 PM CARE MANAGEMENT NOTE 03/30/2014  Patient:  Crystal Robinson,Crystal Robinson   Account Number:  [id number]  Date Initiated:  03/29/2014  Documentation initiated by:  Lanier ClamMAHABIR,Nakaya Mishkin  Subjective/Objective Assessment:   74 y/o f admitted w/R leg dvt.Hx:MR.     Action/Plan:   From home w/family.Has w/c,lift chair.   Anticipated DC Date:  03/31/2014   Anticipated DC Plan:  HOME W HOME HEALTH SERVICES      DC Planning Services  CM consult      Choice offered to / List presented to:  C-5 Sibling        HH arranged  HH-1 RN  HH-4 NURSE'S AIDE      HH agency  [facility]   Status of service:  In process, will continue to follow Medicare Important Message given?  YES (If response is "NO", the following Medicare IM given date fields will be blank) Date Medicare IM given:  03/30/2014 Medicare IM given by:  [facility]heal[facility] Sulak Date Additional Medicare IM given:   Additional Medicare IM given by:    Discharge Disposition:    Per UR Regulation:  Reviewed for med. necessity/level of care/duration of stay  If discussed at Long Length of Stay Meetings, dates discussed:    Comments:  03/30/14 Lanier ClamKathy Deaja Rizo RN BSN NCM [phone] [facility] chosen, TC Kristen aware of HHRN,aide orders.Await d/c.  03/29/14 Lanier ClamKathy Ritchard Paragas RN BSN NCM [phone] Provided family w/HHC list await choice.

## 2014-03-30 LAB — BASIC METABOLIC PANEL
Anion gap: 11 (ref 5–15)
BUN: 16 mg/dL (ref 6–23)
CALCIUM: 9.7 mg/dL (ref 8.4–10.5)
CHLORIDE: 105 meq/L (ref 96–112)
CO2: 23 mmol/L (ref 19–32)
Creatinine, Ser: 0.75 mg/dL (ref 0.50–1.10)
GFR calc Af Amer: >90 mL/min (ref >90)
GFR calc non Af Amer: 83 mL/min — ABNORMAL LOW (ref >90)
GLUCOSE: 136 mg/dL — AB (ref 70–99)
POTASSIUM: 3.6 mmol/L (ref 3.5–5.1)
Sodium: 139 mmol/L (ref 135–145)

## 2014-03-30 LAB — GLUCOSE, CAPILLARY
GLUCOSE-CAPILLARY: 131 mg/dL — AB (ref 70–99)
GLUCOSE-CAPILLARY: 120 mg/dL — AB (ref 70–99)
GLUCOSE-CAPILLARY: 128 mg/dL — AB (ref 70–99)

## 2014-03-30 LAB — CBC
HCT: 38.9 % (ref 36.0–46.0)
Hemoglobin: 13 g/dL (ref 12.0–15.0)
MCH: 31 pg (ref 26.0–34.0)
MCHC: 33.4 g/dL (ref 30.0–36.0)
MCV: 92.8 fL (ref 78.0–100.0)
Platelets: 230 10*3/uL (ref 150–400)
RBC: 4.19 MIL/uL (ref 3.87–5.11)
RDW: 12.6 % (ref 11.5–15.5)
WBC: 10.4 10*3/uL (ref 4.0–10.5)

## 2014-03-30 LAB — HEPARIN LEVEL (UNFRACTIONATED): Heparin Unfractionated: 0.19 IU/mL — ABNORMAL LOW (ref 0.30–0.70)

## 2014-03-30 LAB — HOMOCYSTEINE: Homocysteine: 10.9 umol/L (ref 0.0–15.0)

## 2014-03-30 MED ORDER — RIVAROXABAN 15 MG PO TABS
15.0000 mg | ORAL_TABLET | Freq: Two times a day (BID) | ORAL | Status: DC
Start: 2014-03-30 — End: 2014-03-31
  Administered 2014-03-30 – 2014-03-31 (×2): 15 mg via ORAL
  Filled 2014-03-30 (×3): qty 1

## 2014-03-30 MED ORDER — HEPARIN (PORCINE) IN NACL 100-0.45 UNIT/ML-% IJ SOLN
1300.0000 [IU]/h | INTRAMUSCULAR | Status: AC
  Administered 2014-03-30: 1300 [IU]/h via INTRAVENOUS
  Filled 2014-03-30: qty 250

## 2014-03-30 MED ORDER — POTASSIUM CHLORIDE CRYS ER 20 MEQ PO TBCR
40.0000 meq | EXTENDED_RELEASE_TABLET | Freq: Once | ORAL | Status: AC
  Administered 2014-03-30: 40 meq via ORAL
  Filled 2014-03-30: qty 2

## 2014-03-30 MED ORDER — RIVAROXABAN (XARELTO) VTE STARTER PACK (15 & 20 MG): ORAL_TABLET | ORAL | Status: DC

## 2014-03-30 MED ORDER — RIVAROXABAN 20 MG PO TABS: 20.0000 mg | ORAL_TABLET | Freq: Every day | ORAL | Status: AC

## 2014-03-30 MED ORDER — DIPHENHYDRAMINE HCL 50 MG PO CAPS
50.0000 mg | ORAL_CAPSULE | Freq: Once | ORAL | Status: AC
  Administered 2014-03-30: 50 mg via ORAL
  Filled 2014-03-30: qty 1

## 2014-03-30 NOTE — Discharge Instructions (Signed)
Information on my medicine - XARELTO (rivaroxaban)  This medication education was reviewed with me or my healthcare representative as part of my discharge preparation.  The pharmacist that spoke with me during my hospital stay was:  Jeaneen Cala Elizabeth, [facility]  WHJodelle GrossY WAS Carlena HurlXARELTO PRESCRIBED FOR YOU? Xarelto was prescribed to treat blood clots that may have been found in the veins of your legs (deep vein thrombosis) or in your lungs (pulmonary embolism) and to reduce the risk of them occurring again.  What do you need to know about Xarelto? The starting dose is one 15 mg tablet taken TWICE daily with food for the FIRST 21 DAYS then on (enter date)  04/21/14  the dose is changed to one 20 mg tablet taken ONCE A DAY with your evening meal.  DO NOT stop taking Xarelto without talking to the health care provider who prescribed the medication.  Refill your prescription for 20 mg tablets before you run out.  After discharge, you should have regular check-up appointments with your healthcare provider that is prescribing your Xarelto.  In the future your dose may need to be changed if your kidney function changes by a significant amount.  What do you do if you miss a dose? If you are taking Xarelto TWICE DAILY and you miss a dose, take it as soon as you remember. You may take two 15 mg tablets (total 30 mg) at the same time then resume your regularly scheduled 15 mg twice daily the next day.  If you are taking Xarelto ONCE DAILY and you miss a dose, take it as soon as you remember on the same day then continue your regularly scheduled once daily regimen the next day. Do not take two doses of Xarelto at the same time.   Important Safety Information Xarelto is a blood thinner medicine that can cause bleeding. You should call your healthcare provider right away if you experience any of the following: ? Bleeding from an injury or your nose that does not stop. ? Unusual colored urine (red or dark  brown) or unusual colored stools (red or black). ? Unusual bruising for unknown reasons. ? A serious fall or if you hit your head (even if there is no bleeding).  Some medicines may interact with Xarelto and might increase your risk of bleeding while on Xarelto. To help avoid this, consult your healthcare provider or pharmacist prior to using any new prescription or non-prescription medications, including herbals, vitamins, non-steroidal anti-inflammatory drugs (NSAIDs) and supplements.  This website has more information on Xarelto: [email or web address].

## 2014-03-30 NOTE — Progress Notes (Addendum)
ANTICOAGULATION CONSULT NOTE - Follow Up Consult  Pharmacy Consult for Xarelto Indication: DVT  No Known Allergies  Patient Measurements: Height: 5\' 6"  (167.6 cm) Weight: 186 lb 15.2 oz (84.8 kg) IBW/kg (Calculated) : 59.3 Heparin Dosing Weight: 77.4 kg  Vital Signs: Temp: 97.9 F (36.6 C) (01/15 0807) Temp Source: Axillary (01/15 0807) BP: 132/53 mmHg (01/15 0807) Pulse Rate: 91 (01/15 0807)  Labs:  Recent Labs  03/28/14 1556 03/28/14 1703 03/28/14 2015  03/29/14 0025 03/29/14 1005 03/29/14 1800 03/30/14 0510  HGB 15.1*  --   --   --  13.5  --   --  13.0  HCT 44.9  --   --   --  41.3  --   --  38.9  PLT 260  --   --   --  252  --   --  230  APTT  --  33 37  --   --   --   --   --   LABPROT  --  14.2 14.6  --  14.3  --   --   --   INR  --  1.09 1.13  --  1.10  --   --   --   HEPARINUNFRC  --   --   --   < > 0.25* 0.57 0.71* 0.19*  CREATININE 0.69  --   --   --  0.75  --   --  0.75  < > = values in this interval not displayed.  Estimated Creatinine Clearance: 70.8 mL/min (by C-G formula based on Cr of 0.75).   Medications:  Infusions:  . heparin 1,300 Units/hr (03/30/14 [id number])    Assessment: [age] yoF admitted on 1/13 with RLE swelling and redness x1-2 days.  She is mostly non-ambulatory d/t MR.  PMH also includes HTN, HLD, and DM.  Doppler shows extensive occlusive right DVT.  Pharmacy is initially consulted to dose Heparin IV, with change to Xarelto on 03/30/14.  Today, 03/30/2014  Heparin level 0.19, this morning.    Rate was increased with next HL scheduled for 1500 today.  This level is discontinued with planned change to Xarelto.  CBC: Hgb and Plt remain WNL.  No bleeding or infusion complications reported.  SCr 0.75, CrCl ~ 70 ml/min.   Goal of Therapy:  Heparin level 0.3-0.7 units/ml Monitor platelets by anticoagulation protocol: Yes   Plan:   Continue heparin IV infusion at 1300 units/hr, DISCONTINUE at 1700 tonight  Start Xarelto 15mg  BID  x21 days tonight at 1700, then 20mg  PO daily  Pharmacy to follow up renal function, CBC  Pharmacy to provide Xarelto education to family if available.   Lynann Beaverhristine Lakedra Washington PharmD, BCPS Pager [phone] 03/30/2014 2:26 PM    Addendum: Pt's brother and primary caregiver, Ray, educated about Xarelto use.  He was also given outpatient prescriptions for Xarelto starter pack to [facility] prior to the weekend.  Lynann Beaverhristine Zanita Millman PharmD, BCPS Pager [phone] 03/30/2014 3:17 PM

## 2014-03-30 NOTE — Progress Notes (Signed)
ANTICOAGULATION CONSULT NOTE - Follow Up  Pharmacy Consult for Heparin Indication: DVT  No Known Allergies  Patient Measurements: Height: 5\' 6"  (167.6 cm) Weight: 186 lb 15.2 oz (84.8 kg) IBW/kg (Calculated) : 59.3 Heparin Dosing Weight: 77.4 kg  Vital Signs: Temp: 97.7 F (36.5 C) (01/15 0513) Temp Source: Oral (01/15 0513) BP: 119/69 mmHg (01/15 0513) Pulse Rate: 81 (01/15 0513)  Labs:  Recent Labs  03/28/14 1556 03/28/14 1703 03/28/14 2015  03/29/14 0025 03/29/14 1005 03/29/14 1800 03/30/14 0510  HGB 15.1*  --   --   --  13.5  --   --  13.0  HCT 44.9  --   --   --  41.3  --   --  38.9  PLT 260  --   --   --  252  --   --  230  APTT  --  33 37  --   --   --   --   --   LABPROT  --  14.2 14.6  --  14.3  --   --   --   INR  --  1.09 1.13  --  1.10  --   --   --   HEPARINUNFRC  --   --   --   < > 0.25* 0.57 0.71* 0.19*  CREATININE 0.69  --   --   --  0.75  --   --   --   < > = values in this interval not displayed.  Estimated Creatinine Clearance: 70.8 mL/min (by C-G formula based on Cr of 0.75).   Medications:  Infusions:  . sodium chloride 75 mL/hr at 03/29/14 0826  . heparin      Assessment: [age] yoF admitted on 1/13 with RLE swelling and redness x1-2 days.  She is mostly non-ambulatory d/t MR.  PMH also includes HTN, HLD, and DM.  Doppler shows extensive occlusive right DVT.  Pharmacy is consulted to dose Heparin IV.   03/30/2014  Heparin level 0.71, continues to trend up and is now slightly supratherapeutic on heparin 14.5 ml/hr. No bleeding or infusion complications reported. Today, 03/31/2014  0510 HL low=0.19, no problems per RN.   Goal of Therapy:  Heparin level 0.3-0.7 units/ml Monitor platelets by anticoagulation protocol: Yes   Plan:   Increase heparin drip to 1300 units/hr  Recheck heparin level in ~8 hours.  Daily heparin level and CBC.  Continue to monitor H&H and platelets.  Follow up plan for long-term anticoagulation.   Susanne GreenhouseGreen,  Rasool Rommel R  03/30/2014,6:50 AM.

## 2014-03-30 NOTE — Progress Notes (Signed)
Report received from Myrna Borun, RN. No change in previous assessment. Will continue to monitor.  

## 2014-03-30 NOTE — Progress Notes (Signed)
[facility] PROGRESS NOTE  Crystal Robinson [id number] DOB: Nov 22, 1940 DOA: 03/28/2014 PCP: Jackie PlumSEI-BONSU,GEORGE, MD  Assessment/Plan: #1 extensive occlusive DVT of, femoral/femoral/popliteal/posterior tibial/peroneal veins Likely secondary to his sedentary and immobility/nonambulatory status. Due to concerns for phlegmasia cerulea dolens patient was seen by vascular surgery who who felt there was no evidence of phlegmasia cerulea dolens. Transition from IV heparin to xarelto. Compression stockings. Patient will likely need 6-9 months of anticoagulation. Outpatient follow-up.  #2 hypertension Stable. Continue home regimen of ramipril and HCTZ.  #3 well-controlled type 2 diabetes Hemoglobin A1c 6.5. CBGs have ranged from 120-131. Continue sliding scale insulin.  #4 hyperlipidemia LDL at goal of 32. Continue statin.  #5 prophylaxis Protonix for GI prophylaxis. Heparin for DVT.  Code Status: DO NOT RESUSCITATE Family Communication: Updated brother at bedside. Disposition Plan: Home when medically stable, hopefully in 1-2 days.   Consultants:  Vascular surgery: Dr. Hart RochesterLawson 03/28/2014  Procedures:  Bilateral lower extremity Dopplers 03/28/2014  Antibiotics:  None  HPI/Subjective: Patient with no complaints.  Objective: Filed Vitals:   03/30/14 1414  BP: 121/65  Pulse: 91  Temp: 97.8 F (36.6 C)  Resp: 18    Intake/Output Summary (Last 24 hours) at 03/30/14 1516 Last data filed at 03/30/14 0700  Gross per 24 hour  Intake 805.25 ml  Output      0 ml  Net 805.25 ml   Filed Weights   03/28/14 1702 03/29/14 0541 03/30/14 0531  Weight: 90.719 kg (200 lb) 85.1 kg (187 lb 9.8 oz) 84.8 kg (186 lb 15.2 oz)    Exam:   General:  NAD  Cardiovascular: RRR  Respiratory: CTAB anterior lung fields  Abdomen: Soft, nontender, nondistended, positive bowel sounds.  Musculoskeletal: No clubbing cyanosis. Right lower extremity less swollen, less tender to palpation,  left foot is warm with pulses noted.  Data Reviewed: Basic Metabolic Panel:  Recent Labs Lab 03/28/14 1556 03/28/14 2015 03/29/14 0025 03/30/14 0510  NA 137  --  138 139  K 3.7  --  4.0 3.6  CL 103  --  105 105  CO2 24  --  28 23  GLUCOSE 175*  --  133* 136*  BUN 15  --  14 16  CREATININE 0.69  --  0.75 0.75  CALCIUM 10.8*  --  10.3 9.7  MG  --  1.8  --   --    Liver Function Tests:  Recent Labs Lab 03/28/14 2015  AST 25  ALT 17  ALKPHOS 61  BILITOT 1.0  PROT 7.2  ALBUMIN 3.7   No results for input(s): LIPASE, AMYLASE in the last 168 hours. No results for input(s): AMMONIA in the last 168 hours. CBC:  Recent Labs Lab 03/28/14 1556 03/29/14 0025 03/30/14 0510  WBC 8.7 8.4 10.4  NEUTROABS 5.8  --   --   HGB 15.1* 13.5 13.0  HCT 44.9 41.3 38.9  MCV 92.0 93.0 92.8  PLT 260 252 230   Cardiac Enzymes: No results for input(s): CKTOTAL, CKMB, CKMBINDEX, TROPONINI in the last 168 hours. BNP (last 3 results) No results for input(s): PROBNP in the last 8760 hours. CBG:  Recent Labs Lab 03/29/14 1141 03/29/14 1713 03/29/14 2156 03/30/14 0719 03/30/14 1126  GLUCAP 159* 137* 137* 131* 120*    No results found for this or any previous visit (from the past 240 hour(s)).   Studies: No results found.  Scheduled Meds: . aspirin  81 mg Oral Daily  . cholecalciferol  2,000 Units Oral Daily  .  docusate sodium  100 mg Oral BID  . fenofibrate  160 mg Oral Daily  . hydrochlorothiazide  25 mg Oral Daily  . insulin aspart  0-15 Units Subcutaneous TID WC  . pantoprazole  40 mg Oral Q0600  . ramipril  10 mg Oral Daily  . Rivaroxaban  15 mg Oral BID WC  . sodium chloride  3 mL Intravenous Q12H  . vitamin C  500 mg Oral Daily  . vitamin E  400 Units Oral Daily   Continuous Infusions: . heparin 1,300 Units/hr (03/30/14 6045)    Principal Problem:   Dvt femoral (deep venous thrombosis) Active Problems:   Mental retardation   HYPERTENSION, BENIGN SYSTEMIC    DM (diabetes mellitus), type 2   Hyperlipidemia   DVT (deep venous thrombosis)    Time spent: 35 minutes    [facility] MD [facility] Pager [phone]. If 7PM-7AM, please contact night-coverage at [email or web address], password [facility] 03/30/2014, 3:16 PM  LOS: 2 days

## 2014-03-31 LAB — PROTEIN S, TOTAL: Protein S Ag, Total: 149 % (ref 58–150)

## 2014-03-31 LAB — BASIC METABOLIC PANEL
Anion gap: 11 (ref 5–15)
BUN: 18 mg/dL (ref 6–23)
CALCIUM: 9.8 mg/dL (ref 8.4–10.5)
CHLORIDE: 102 meq/L (ref 96–112)
CO2: 24 mmol/L (ref 19–32)
CREATININE: 0.8 mg/dL (ref 0.50–1.10)
GFR calc Af Amer: 84 mL/min — ABNORMAL LOW (ref >90)
GFR, EST NON AFRICAN AMERICAN: 72 mL/min — AB (ref >90)
Glucose, Bld: 142 mg/dL — ABNORMAL HIGH (ref 70–99)
POTASSIUM: 3.7 mmol/L (ref 3.5–5.1)
Sodium: 137 mmol/L (ref 135–145)

## 2014-03-31 LAB — CBC
HEMATOCRIT: 40.1 % (ref 36.0–46.0)
HEMOGLOBIN: 13.4 g/dL (ref 12.0–15.0)
MCH: 30.9 pg (ref 26.0–34.0)
MCHC: 33.4 g/dL (ref 30.0–36.0)
MCV: 92.4 fL (ref 78.0–100.0)
PLATELETS: 273 10*3/uL (ref 150–400)
RBC: 4.34 MIL/uL (ref 3.87–5.11)
RDW: 12.4 % (ref 11.5–15.5)
WBC: 6.4 10*3/uL (ref 4.0–10.5)

## 2014-03-31 LAB — GLUCOSE, CAPILLARY
GLUCOSE-CAPILLARY: 149 mg/dL — AB (ref 70–99)
Glucose-Capillary: 175 mg/dL — ABNORMAL HIGH (ref 70–99)

## 2014-03-31 LAB — PROTHROMBIN GENE MUTATION

## 2014-03-31 LAB — PROTEIN C, TOTAL: Protein C, Total: 106 % (ref 70–140)

## 2014-03-31 LAB — FACTOR 5 LEIDEN

## 2014-03-31 LAB — PROTEIN C ACTIVITY: PROTEIN C ACTIVITY: 196 % — AB (ref 74–151)

## 2014-03-31 LAB — PROTEIN S ACTIVITY: Protein S Activity: 108 % (ref 60–145)

## 2014-03-31 MED ORDER — PANTOPRAZOLE SODIUM 40 MG PO TBEC: 40.0000 mg | DELAYED_RELEASE_TABLET | Freq: Every day | ORAL | Status: DC

## 2014-03-31 NOTE — Discharge Summary (Signed)
Physician Discharge Summary  Crystal Deteratricia Minahan ZSW:[id number] DOB: 1941-01-12 DOA: 03/28/2014  PCP: Jackie PlumSEI-BONSU,GEORGE, MD  Admit date: 03/28/2014 Discharge date: 03/31/2014  Time spent: 60 minutes  Recommendations for Outpatient Follow-up:  1. Follow-up with OSEI-BONSU,GEORGE, MD in 1-2 weeks. On follow-up patient need a CBC done to follow-up on her hemoglobin. Hypercoagulable panel that was ordered during this hospitalization when he to be followed up upon. Patient will likely need anywhere from 6-9 months of anticoagulation as this is her first episode.   Discharge Diagnoses:  Principal Problem:   Dvt femoral (deep venous thrombosis) Active Problems:   Mental retardation   HYPERTENSION, BENIGN SYSTEMIC   DM (diabetes mellitus), type 2   Hyperlipidemia   DVT (deep venous thrombosis)   Discharge Condition: Stable and improved.  Diet recommendation: Carb modified.  Filed Weights   03/29/14 0541 03/30/14 0531 03/31/14 0515  Weight: 85.1 kg (187 lb 9.8 oz) 84.8 kg (186 lb 15.2 oz) 81 kg (178 lb 9.2 oz)    History of present illness:  Crystal Robinson is a 74 y.o. female  With history of mental retardation, hypertension, diabetes, hyperlipidemia who presents to the ED with a 1-2 day history of right lower extremity swelling and redness per family. Patient's brother and her sister-in-law at bedside and provide all the history as patient does have MR. Per brother patient is essentially nonambulatory however is able to do transfers and somewhat wheelchair-bound. Per brother and sister-in-law 1 day prior to admission it was noted that patient did have some right lower extremity swelling and tightness with some redness and some warmth to it. Though unable to determine whether it was painful for the patient. They did endorse that patient did have some generalized weakness/fatigue. Patient did not have any fevers, no chills, no nausea, no vomiting, no chest pain, no shortness of breath, no diarrhea, no  constipation, no melena, no hematemesis, no hematochezia, no cough. They denied any recent surgeries. Patient was seen in the emergency room, basic metabolic profile which was done had a calcium of 10.8 otherwise was unremarkable. CBC done had a hemoglobin of 15.1 otherwise was unremarkable. Bilateral lower extremity Dopplers were done that showed an extensive occlusive DVT of the common femoral, femoral, popliteal, posterior tibial and peroneal veins of the right lower extremity with also a superficial thrombosis of the greater saphenous and lesser saphenous veins. Patient also noted to have some mottling and bluish discoloration of the right foot, felt suggestive of phlegmasia cerulea dolens. ED PA stated he spoke with vascular surgery that had recommended IV heparin lower extremity elevation and fitting for compression stockings. [facility] were called to admit the patient for further evaluation and management.  Hospital Course:  #1 extensive occlusive DVT of, femoral/femoral/popliteal/posterior tibial/peroneal veins Patient had presented with right lower extremity erythema, swelling. Lower extremity Dopplers which were done in the emergency room with consistent with an extensive occlusive DVT of the femoral, femoral, popliteal, posterior tibial and peroneal veins. Patient was placed on IV heparin and admitted. Likely secondary to his sedentary and immobility/nonambulatory status. Due to concerns for phlegmasia cerulea dolens patient was seen by vascular surgery who who felt there was no evidence of phlegmasia cerulea dolens. Hypercoagulable panel was obtained and will need to be followed up upon as outpatient. Patient was maintained on IV heparin placed on TED hose with improvement in swelling and erythema. Patient was subsequently transitioned from IV heparin to xarelto. Patient had no bleeding episodes during the hospitalization. Patient be discharged home in stable and  improved condition on  xarelto. Patient will likely need 6-9 months of anticoagulation. Outpatient follow-up.  #2 hypertension Stable. Continued on home regimen of ramipril and HCTZ.  #3 well-controlled type 2 diabetes Hemoglobin A1c 6.5. CBGs remained well controlled during the hospitalization. Patient was maintained on a sliding scale insulin.   #4 hyperlipidemia LDL at goal of 32. Continued on statin.   Procedures:  Bilateral lower extremity Dopplers 03/28/2014  Consultations:  Vascular surgery: Dr. Hart Rochester 03/28/2014    Discharge Exam: Filed Vitals:   03/31/14 0515  BP: 127/64  Pulse: 85  Temp: 98.2 F (36.8 C)  Resp: 18    General: NAD Cardiovascular: RRR Respiratory: CTAB  Discharge Instructions   Discharge Instructions    Diet Carb Modified    Complete by:  As directed      Discharge instructions    Complete by:  As directed   Follow up with OSEI-BONSU,GEORGE, MD in 1-2 weeks. Get fitted for long leg elastic compression stockings 20-30 mm gradient.     Increase activity slowly    Complete by:  As directed           Current Discharge Medication List    START taking these medications   Details  pantoprazole (PROTONIX) 40 MG tablet Take 1 tablet (40 mg total) by mouth daily at 6 (six) AM. Qty: 30 tablet, Refills: 0    Rivaroxaban (XARELTO STARTER PACK) 15 & 20 MG TBPK Take as directed on package: Start with one  tablet by mouth twice a day with food. On Day 22, switch to one  tablet once a day with food. Qty: 51 each, Refills: 0    rivaroxaban (XARELTO) 20 MG TABS tablet Take 1 tablet (20 mg total) by mouth daily with supper. Qty: 30 tablet, Refills: 0      CONTINUE these medications which have NOT CHANGED   Details  acetaminophen (TYLENOL) 650 MG CR tablet Take 1,300 mg by mouth daily as needed for pain (joint pain).    Cholecalciferol (VITAMIN D3) 2000 UNITS TABS Take 1 tablet by mouth daily.    diphenhydramine-acetaminophen (TYLENOL PM) 25-500 MG TABS  Take 3 tablets by mouth at bedtime as needed (sleep).    fenofibrate (TRICOR) 145 MG tablet Take 145 mg by mouth daily.    Garlic 1000 MG CAPS Take 1,000 mg by mouth daily.    glipiZIDE (GLUCOTROL XL) 5 MG 24 hr tablet Take 1 tablet (5 mg total) by mouth daily. Qty: 90 tablet, Refills: 2   Associated Diagnoses: Type II or unspecified type diabetes mellitus without mention of complication, not stated as uncontrolled    hydrochlorothiazide (HYDRODIURIL) 25 MG tablet Take 1 tablet (25 mg total) by mouth daily. Qty: 90 tablet, Refills: 2   Associated Diagnoses: Essential hypertension, benign    Multiple Vitamins-Minerals (MULTIVITAMIN & MINERAL PO) Take 1 tablet by mouth daily.    ramipril (ALTACE) 10 MG capsule Take 1 capsule (10 mg total) by mouth daily. Qty: 90 capsule, Refills: 2   Associated Diagnoses: Type II or unspecified type diabetes mellitus without mention of complication, not stated as uncontrolled    vitamin C (ASCORBIC ACID) 500 MG tablet Take 500 mg by mouth daily.    vitamin E 400 UNIT capsule Take 400 Units by mouth daily.      STOP taking these medications     aspirin 81 MG chewable tablet        No Known Allergies Follow-up Information    Follow up with OSEI-BONSU,GEORGE, MD.  Schedule an appointment as soon as possible for a visit in 1 week.   Specialty:  Internal Medicine   Why:  f/u in 1-2 weeks.   Contact information:   [address] [place] [address] [phone]        The results of significant diagnostics from this hospitalization (including imaging, microbiology, ancillary and laboratory) are listed below for reference.    Significant Diagnostic Studies: No results found.  Microbiology: No results found for this or any previous visit (from the past 240 hour(s)).   Labs: Basic Metabolic Panel:  Recent Labs Lab 03/28/14 1556 03/28/14 2015 03/29/14 0025 03/30/14 0510 03/31/14 0513  NA 137  --  138 139 137  K 3.7   --  4.0 3.6 3.7  CL 103  --  105 105 102  CO2 24  --  GLUCOSE 175*  --  133* 136* 142*  BUN 15  --  CREATININE 0.69  --  0.75 0.75 0.80  CALCIUM 10.8*  --  10.3 9.7 9.8  MG  --  1.8  --   --   --    Liver Function Tests:  Recent Labs Lab 03/28/14 2015  AST 25  ALT 17  ALKPHOS 61  BILITOT 1.0  PROT 7.2  ALBUMIN 3.7   No results for input(s): LIPASE, AMYLASE in the last 168 hours. No results for input(s): AMMONIA in the last 168 hours. CBC:  Recent Labs Lab 03/28/14 1556 03/29/14 0025 03/30/14 0510 03/31/14 0513  WBC 8.7 8.4 10.4 6.4  NEUTROABS 5.8  --   --   --   HGB 15.1* 13.5 13.0 13.4  HCT 44.9 41.3 38.9 40.1  MCV 92.0 93.0 92.8 92.4  PLT 260 252 230 273   Cardiac Enzymes: No results for input(s): CKTOTAL, CKMB, CKMBINDEX, TROPONINI in the last 168 hours. BNP: BNP (last 3 results) No results for input(s): PROBNP in the last 8760 hours. CBG:  Recent Labs Lab 03/30/14 0719 03/30/14 1126 03/30/14 1708 03/31/14 0808 03/31/14 1213  GLUCAP 131* 120* 128* 175* 149*       Signed:  THOMPSON,DANIEL MD [facility] 03/31/2014, 12:54 PM

## 2014-03-31 NOTE — Clinical Social Work Note (Signed)
  CSW received call from RN requesting pt transport  CSW prepared packet and gave to RN  CSW called transport  CSW met with pt and her parents at bedside to let them know transport had been called  .Dede Query, LCSW [facility] Clinical Social Worker - Weekend Coverage cell #: [phone]

## 2014-03-31 NOTE — Care Management (Signed)
CARE MANAGEMENT NOTE 03/31/2014  Patient:  Crystal Robinson,Crystal Robinson   Account Number:  [id number]  Date Initiated:  03/29/2014  Documentation initiated by:  Lanier ClamMAHABIR,KATHY  Subjective/Objective Assessment:   74 y/o f admitted w/R leg dvt.Hx:MR.     Action/Plan:   From home w/family.Has w/c,lift chair.   Anticipated DC Date:  03/31/2014   Anticipated DC Plan:  HOME W HOME HEALTH SERVICES      DC Planning Services  CM consult      Choice offered to / List presented to:  C-5 Sibling        HH arranged  HH-1 RN  HH-4 NURSE'S AIDE      HH agency  [facility]   Status of service:  In process, will continue to follow Medicare Important Message given?  YES (If response is "NO", the following Medicare IM given date fields will be blank) Date Medicare IM given:  03/30/2014 Medicare IM given by:  [facility] Date Additional Medicare IM given:   Additional Medicare IM given by:    Discharge Disposition:    Per UR Regulation:  Reviewed for med. necessity/level of care/duration of stay  If discussed at Long Length of Stay Meetings, dates discussed:    Comments:  03/31/14 - CM spoke with family. No home DME needs. Awaiting d/c with [facility]. Rubie MaidCrystal Traveon Louro RN BSN CCM [phone]  03/30/14 Lanier ClamKathy Mahabir RN BSN NCM [id number] [facility] chosen, TC Kristen aware of HHRN,aide orders.Await d/c.  03/29/14 Lanier ClamKathy Mahabir RN BSN NCM [phone] Provided family w/HHC list await choice.

## 2014-04-18 ENCOUNTER — Inpatient Hospital Stay (HOSPITAL_COMMUNITY): Payer: Medicare Other

## 2014-04-18 ENCOUNTER — Emergency Department (HOSPITAL_COMMUNITY): Payer: Medicare Other

## 2014-04-18 ENCOUNTER — Encounter (HOSPITAL_COMMUNITY)
Admission: EM | Disposition: A | Discharge status: Home Health | Payer: Self-pay | Source: Home / Self Care | Attending: Internal Medicine

## 2014-04-18 ENCOUNTER — Encounter (HOSPITAL_COMMUNITY): Payer: Self-pay | Admitting: Emergency Medicine

## 2014-04-18 ENCOUNTER — Inpatient Hospital Stay (HOSPITAL_COMMUNITY)
Admission: EM | Admit: 2014-04-18 | Discharge: 2014-04-22 | DRG: 481 | Disposition: A | Discharge status: Home Health | Payer: Medicare Other | Attending: Internal Medicine | Admitting: Internal Medicine

## 2014-04-18 DIAGNOSIS — N390 Urinary tract infection, site not specified: Secondary | ICD-10-CM | POA: Diagnosis present

## 2014-04-18 DIAGNOSIS — E1165 Type 2 diabetes mellitus with hyperglycemia: Secondary | ICD-10-CM | POA: Diagnosis present

## 2014-04-18 DIAGNOSIS — I82401 Acute embolism and thrombosis of unspecified deep veins of right lower extremity: Secondary | ICD-10-CM

## 2014-04-18 DIAGNOSIS — D62 Acute posthemorrhagic anemia: Secondary | ICD-10-CM | POA: Diagnosis not present

## 2014-04-18 DIAGNOSIS — S7290XA Unspecified fracture of unspecified femur, initial encounter for closed fracture: Secondary | ICD-10-CM | POA: Insufficient documentation

## 2014-04-18 DIAGNOSIS — E780 Pure hypercholesterolemia: Secondary | ICD-10-CM | POA: Diagnosis present

## 2014-04-18 DIAGNOSIS — S72401A Unspecified fracture of lower end of right femur, initial encounter for closed fracture: Principal | ICD-10-CM | POA: Diagnosis present

## 2014-04-18 DIAGNOSIS — E785 Hyperlipidemia, unspecified: Secondary | ICD-10-CM | POA: Diagnosis present

## 2014-04-18 DIAGNOSIS — Z86718 Personal history of other venous thrombosis and embolism: Secondary | ICD-10-CM | POA: Diagnosis not present

## 2014-04-18 DIAGNOSIS — E119 Type 2 diabetes mellitus without complications: Secondary | ICD-10-CM

## 2014-04-18 DIAGNOSIS — W050XXA Fall from non-moving wheelchair, initial encounter: Secondary | ICD-10-CM | POA: Diagnosis present

## 2014-04-18 DIAGNOSIS — E876 Hypokalemia: Secondary | ICD-10-CM | POA: Diagnosis present

## 2014-04-18 DIAGNOSIS — F79 Unspecified intellectual disabilities: Secondary | ICD-10-CM | POA: Diagnosis present

## 2014-04-18 DIAGNOSIS — Z95828 Presence of other vascular implants and grafts: Secondary | ICD-10-CM

## 2014-04-18 DIAGNOSIS — S72001A Fracture of unspecified part of neck of right femur, initial encounter for closed fracture: Secondary | ICD-10-CM

## 2014-04-18 DIAGNOSIS — B9620 Unspecified Escherichia coli [E. coli] as the cause of diseases classified elsewhere: Secondary | ICD-10-CM | POA: Diagnosis present

## 2014-04-18 DIAGNOSIS — I10 Essential (primary) hypertension: Secondary | ICD-10-CM | POA: Diagnosis present

## 2014-04-18 DIAGNOSIS — S72413A Displaced unspecified condyle fracture of lower end of unspecified femur, initial encounter for closed fracture: Secondary | ICD-10-CM | POA: Diagnosis present

## 2014-04-18 DIAGNOSIS — S7291XA Unspecified fracture of right femur, initial encounter for closed fracture: Secondary | ICD-10-CM | POA: Diagnosis present

## 2014-04-18 DIAGNOSIS — I82419 Acute embolism and thrombosis of unspecified femoral vein: Secondary | ICD-10-CM | POA: Diagnosis present

## 2014-04-18 DIAGNOSIS — M79604 Pain in right leg: Secondary | ICD-10-CM

## 2014-04-18 DIAGNOSIS — Z7901 Long term (current) use of anticoagulants: Secondary | ICD-10-CM

## 2014-04-18 DIAGNOSIS — S72409A Unspecified fracture of lower end of unspecified femur, initial encounter for closed fracture: Secondary | ICD-10-CM

## 2014-04-18 DIAGNOSIS — L89150 Pressure ulcer of sacral region, unstageable: Secondary | ICD-10-CM | POA: Diagnosis present

## 2014-04-18 HISTORY — PX: FEMUR IM NAIL: SHX1597

## 2014-04-18 HISTORY — DX: Acute embolism and thrombosis of unspecified deep veins of unspecified lower extremity: I82.409

## 2014-04-18 LAB — URINALYSIS, ROUTINE W REFLEX MICROSCOPIC
Bilirubin Urine: NEGATIVE
Glucose, UA: NEGATIVE mg/dL
Ketones, ur: 15 mg/dL — AB
Nitrite: POSITIVE — AB
Protein, ur: NEGATIVE mg/dL
SPECIFIC GRAVITY, URINE: 1.025 (ref 1.005–1.030)
Urobilinogen, UA: 0.2 mg/dL (ref 0.0–1.0)
pH: 5 (ref 5.0–8.0)

## 2014-04-18 LAB — COMPREHENSIVE METABOLIC PANEL
ALK PHOS: 57 U/L (ref 39–117)
ALT: 22 U/L (ref 0–35)
ANION GAP: 8 (ref 5–15)
AST: 31 U/L (ref 0–37)
Albumin: 3.4 g/dL — ABNORMAL LOW (ref 3.5–5.2)
BILIRUBIN TOTAL: 1 mg/dL (ref 0.3–1.2)
BUN: 10 mg/dL (ref 6–23)
CO2: 31 mmol/L (ref 19–32)
Calcium: 10.1 mg/dL (ref 8.4–10.5)
Chloride: 105 mmol/L (ref 96–112)
Creatinine, Ser: 0.87 mg/dL (ref 0.50–1.10)
GFR, EST AFRICAN AMERICAN: 76 mL/min — AB (ref >90)
GFR, EST NON AFRICAN AMERICAN: 65 mL/min — AB (ref >90)
Glucose, Bld: 164 mg/dL — ABNORMAL HIGH (ref 70–99)
Potassium: 2.9 mmol/L — ABNORMAL LOW (ref 3.5–5.1)
SODIUM: 144 mmol/L (ref 135–145)
TOTAL PROTEIN: 6.1 g/dL (ref 6.0–8.3)

## 2014-04-18 LAB — URINE MICROSCOPIC-ADD ON

## 2014-04-18 LAB — GLUCOSE, CAPILLARY
GLUCOSE-CAPILLARY: 135 mg/dL — AB (ref 70–99)
Glucose-Capillary: 138 mg/dL — ABNORMAL HIGH (ref 70–99)
Glucose-Capillary: 168 mg/dL — ABNORMAL HIGH (ref 70–99)

## 2014-04-18 LAB — CBC WITH DIFFERENTIAL/PLATELET
Basophils Absolute: 0.1 10*3/uL (ref 0.0–0.1)
Basophils Relative: 1 % (ref 0–1)
Eosinophils Absolute: 0.2 10*3/uL (ref 0.0–0.7)
Eosinophils Relative: 2 % (ref 0–5)
HCT: 41.7 % (ref 36.0–46.0)
Hemoglobin: 14.2 g/dL (ref 12.0–15.0)
LYMPHS ABS: 1.3 10*3/uL (ref 0.7–4.0)
Lymphocytes Relative: 11 % — ABNORMAL LOW (ref 12–46)
MCH: 30.2 pg (ref 26.0–34.0)
MCHC: 34.1 g/dL (ref 30.0–36.0)
MCV: 88.7 fL (ref 78.0–100.0)
MONO ABS: 0.5 10*3/uL (ref 0.1–1.0)
Monocytes Relative: 4 % (ref 3–12)
NEUTROS ABS: 9.5 10*3/uL — AB (ref 1.7–7.7)
Neutrophils Relative %: 82 % — ABNORMAL HIGH (ref 43–77)
Platelets: 313 10*3/uL (ref 150–400)
RBC: 4.7 MIL/uL (ref 3.87–5.11)
RDW: 13.1 % (ref 11.5–15.5)
WBC: 11.5 10*3/uL — ABNORMAL HIGH (ref 4.0–10.5)

## 2014-04-18 LAB — SAMPLE TO BLOOD BANK

## 2014-04-18 LAB — PROTIME-INR
INR: 1.39 (ref 0.00–1.49)
Prothrombin Time: 17.2 seconds — ABNORMAL HIGH (ref 11.6–15.2)

## 2014-04-18 LAB — SURGICAL PCR SCREEN
MRSA, PCR: NEGATIVE
STAPHYLOCOCCUS AUREUS: NEGATIVE

## 2014-04-18 LAB — APTT: APTT: 32 s (ref 24–37)

## 2014-04-18 LAB — ABO/RH: ABO/RH(D): O POS

## 2014-04-18 SURGERY — INSERTION, INTRAMEDULLARY ROD, FEMUR, RETROGRADE
Anesthesia: General | Site: Leg Upper | Laterality: Right

## 2014-04-18 MED ORDER — RIVAROXABAN 20 MG PO TABS: 20.0000 mg | ORAL_TABLET | Freq: Every day | ORAL | Status: DC

## 2014-04-18 MED ORDER — SODIUM CHLORIDE 0.9 % IV SOLN
INTRAVENOUS | Status: DC
  Administered 2014-04-18: 13:00:00 via INTRAVENOUS

## 2014-04-18 MED ORDER — ALBUTEROL SULFATE (2.5 MG/3ML) 0.083% IN NEBU: 2.5000 mg | INHALATION_SOLUTION | RESPIRATORY_TRACT | Status: DC | PRN

## 2014-04-18 MED ORDER — POTASSIUM CHLORIDE 10 MEQ/100ML IV SOLN
10.0000 meq | INTRAVENOUS | Status: DC
  Administered 2014-04-18: 10 meq via INTRAVENOUS
  Filled 2014-04-18 (×3): qty 100

## 2014-04-18 MED ORDER — HYDROCODONE-ACETAMINOPHEN 5-325 MG PO TABS: 1.0000 | ORAL_TABLET | Freq: Four times a day (QID) | ORAL | Status: DC | PRN

## 2014-04-18 MED ORDER — ACETAMINOPHEN 325 MG PO TABS: 650.0000 mg | ORAL_TABLET | Freq: Every day | ORAL | Status: DC | PRN

## 2014-04-18 MED ORDER — PANTOPRAZOLE SODIUM 40 MG PO TBEC
40.0000 mg | DELAYED_RELEASE_TABLET | Freq: Every day | ORAL | Status: DC
  Administered 2014-04-19 – 2014-04-22 (×4): 40 mg via ORAL
  Filled 2014-04-18 (×4): qty 1

## 2014-04-18 MED ORDER — POTASSIUM CHLORIDE CRYS ER 20 MEQ PO TBCR
40.0000 meq | EXTENDED_RELEASE_TABLET | ORAL | Status: AC
  Administered 2014-04-18 (×2): 40 meq via ORAL
  Filled 2014-04-18 (×2): qty 2

## 2014-04-18 MED ORDER — HYDROCHLOROTHIAZIDE 25 MG PO TABS
25.0000 mg | ORAL_TABLET | Freq: Every day | ORAL | Status: DC
Start: 2014-04-18 — End: 2014-04-22
  Administered 2014-04-18 – 2014-04-22 (×4): 25 mg via ORAL
  Filled 2014-04-18 (×5): qty 1

## 2014-04-18 MED ORDER — FENTANYL CITRATE 0.05 MG/ML IJ SOLN
INTRAMUSCULAR | Status: AC
  Filled 2014-04-18: qty 4

## 2014-04-18 MED ORDER — DOCUSATE SODIUM 100 MG PO CAPS
100.0000 mg | ORAL_CAPSULE | Freq: Two times a day (BID) | ORAL | Status: DC
  Administered 2014-04-18: 100 mg via ORAL
  Filled 2014-04-18 (×2): qty 1

## 2014-04-18 MED ORDER — RAMIPRIL 10 MG PO CAPS
10.0000 mg | ORAL_CAPSULE | Freq: Every day | ORAL | Status: DC
  Administered 2014-04-18 – 2014-04-22 (×5): 10 mg via ORAL
  Filled 2014-04-18 (×5): qty 1

## 2014-04-18 MED ORDER — MIDAZOLAM HCL 2 MG/2ML IJ SOLN
INTRAMUSCULAR | Status: AC
  Filled 2014-04-18: qty 4

## 2014-04-18 MED ORDER — MORPHINE SULFATE 2 MG/ML IJ SOLN
0.5000 mg | INTRAMUSCULAR | Status: DC | PRN
  Administered 2014-04-18 – 2014-04-19 (×2): 0.5 mg via INTRAVENOUS
  Filled 2014-04-18 (×2): qty 1

## 2014-04-18 MED ORDER — ONDANSETRON HCL 4 MG/2ML IJ SOLN: 4.0000 mg | Freq: Four times a day (QID) | INTRAMUSCULAR | Status: DC | PRN

## 2014-04-18 MED ORDER — POTASSIUM CHLORIDE 10 MEQ/100ML IV SOLN
10.0000 meq | INTRAVENOUS | Status: AC
  Administered 2014-04-18 (×2): 10 meq via INTRAVENOUS
  Filled 2014-04-18 (×3): qty 100

## 2014-04-18 MED ORDER — VITAMIN E 180 MG (400 UNIT) PO CAPS
400.0000 [IU] | ORAL_CAPSULE | Freq: Every day | ORAL | Status: DC
  Administered 2014-04-19 – 2014-04-22 (×4): 400 [IU] via ORAL
  Filled 2014-04-18 (×5): qty 1

## 2014-04-18 MED ORDER — FENOFIBRATE 54 MG PO TABS
54.0000 mg | ORAL_TABLET | Freq: Every day | ORAL | Status: DC
  Administered 2014-04-19 – 2014-04-22 (×4): 54 mg via ORAL
  Filled 2014-04-18 (×5): qty 1

## 2014-04-18 MED ORDER — HEPARIN (PORCINE) IN NACL 100-0.45 UNIT/ML-% IJ SOLN
1200.0000 [IU]/h | INTRAMUSCULAR | Status: DC
  Administered 2014-04-18: 1200 [IU]/h via INTRAVENOUS
  Filled 2014-04-18 (×2): qty 250

## 2014-04-18 MED ORDER — PROPOFOL 10 MG/ML IV BOLUS
INTRAVENOUS | Status: AC
  Filled 2014-04-18: qty 20

## 2014-04-18 MED ORDER — CEFTRIAXONE SODIUM IN DEXTROSE 20 MG/ML IV SOLN
1.0000 g | INTRAVENOUS | Status: DC
  Administered 2014-04-18 – 2014-04-20 (×3): 1 g via INTRAVENOUS
  Filled 2014-04-18 (×4): qty 50

## 2014-04-18 MED ORDER — INSULIN ASPART 100 UNIT/ML ~~LOC~~ SOLN
0.0000 [IU] | Freq: Every day | SUBCUTANEOUS | Status: DC
  Administered 2014-04-19 – 2014-04-20 (×2): 2 [IU] via SUBCUTANEOUS

## 2014-04-18 MED ORDER — LACTATED RINGERS IV SOLN
INTRAVENOUS | Status: DC | PRN
  Administered 2014-04-18 – 2014-04-19 (×3): via INTRAVENOUS

## 2014-04-18 MED ORDER — INSULIN ASPART 100 UNIT/ML ~~LOC~~ SOLN
0.0000 [IU] | Freq: Three times a day (TID) | SUBCUTANEOUS | Status: DC
  Administered 2014-04-18: 1 [IU] via SUBCUTANEOUS
  Administered 2014-04-19 (×2): 3 [IU] via SUBCUTANEOUS
  Administered 2014-04-19: 2 [IU] via SUBCUTANEOUS
  Administered 2014-04-20 (×2): 3 [IU] via SUBCUTANEOUS
  Administered 2014-04-20 – 2014-04-21 (×2): 1 [IU] via SUBCUTANEOUS
  Administered 2014-04-21: 2 [IU] via SUBCUTANEOUS
  Administered 2014-04-21: 3 [IU] via SUBCUTANEOUS
  Administered 2014-04-22: 2 [IU] via SUBCUTANEOUS

## 2014-04-18 MED ORDER — IOHEXOL 300 MG/ML  SOLN
100.0000 mL | Freq: Once | INTRAMUSCULAR | Status: AC | PRN
Start: 2014-04-18 — End: 2014-04-18
  Administered 2014-04-18: 45 mL via INTRAVENOUS

## 2014-04-18 MED ORDER — SODIUM CHLORIDE 0.9 % IV BOLUS (SEPSIS)
500.0000 mL | Freq: Once | INTRAVENOUS | Status: AC
  Administered 2014-04-18: 500 mL via INTRAVENOUS

## 2014-04-18 MED ORDER — MAGNESIUM CITRATE PO SOLN: 1.0000 | Freq: Once | ORAL | Status: AC | PRN

## 2014-04-18 MED ORDER — FENTANYL CITRATE 0.05 MG/ML IJ SOLN
INTRAMUSCULAR | Status: AC | PRN
  Administered 2014-04-18 (×3): 25 ug via INTRAVENOUS

## 2014-04-18 MED ORDER — ONDANSETRON HCL 4 MG/2ML IJ SOLN
4.0000 mg | Freq: Once | INTRAMUSCULAR | Status: AC
  Administered 2014-04-18: 4 mg via INTRAVENOUS
  Filled 2014-04-18: qty 2

## 2014-04-18 MED ORDER — INSULIN ASPART 100 UNIT/ML ~~LOC~~ SOLN: 0.0000 [IU] | SUBCUTANEOUS | Status: DC

## 2014-04-18 MED ORDER — MIDAZOLAM HCL 2 MG/2ML IJ SOLN
INTRAMUSCULAR | Status: AC | PRN
  Administered 2014-04-18 (×2): 1 mg via INTRAVENOUS

## 2014-04-18 MED ORDER — FENTANYL CITRATE 0.05 MG/ML IJ SOLN
50.0000 ug | Freq: Once | INTRAMUSCULAR | Status: AC
  Administered 2014-04-18: 50 ug via INTRAVENOUS
  Filled 2014-04-18: qty 2

## 2014-04-18 MED ORDER — HEPARIN BOLUS VIA INFUSION
4000.0000 [IU] | Freq: Once | INTRAVENOUS | Status: AC
Start: 2014-04-18 — End: 2014-04-18
  Administered 2014-04-18: 4000 [IU] via INTRAVENOUS
  Filled 2014-04-18: qty 4000

## 2014-04-18 MED ORDER — CEFAZOLIN SODIUM-DEXTROSE 2-3 GM-% IV SOLR
2.0000 g | Freq: Once | INTRAVENOUS | Status: DC
  Filled 2014-04-18: qty 50

## 2014-04-18 MED ORDER — VITAMIN C 500 MG PO TABS
500.0000 mg | ORAL_TABLET | Freq: Every day | ORAL | Status: DC
  Administered 2014-04-19 – 2014-04-22 (×4): 500 mg via ORAL
  Filled 2014-04-18 (×5): qty 1

## 2014-04-18 MED ORDER — BISACODYL 10 MG RE SUPP: 10.0000 mg | Freq: Every day | RECTAL | Status: DC | PRN

## 2014-04-18 MED ORDER — FENTANYL CITRATE 0.05 MG/ML IJ SOLN
100.0000 ug | INTRAMUSCULAR | Status: DC | PRN
  Administered 2014-04-18 (×2): 100 ug via INTRAVENOUS
  Filled 2014-04-18 (×2): qty 2

## 2014-04-18 MED ORDER — SODIUM CHLORIDE 0.9 % IV SOLN
INTRAVENOUS | Status: DC
  Administered 2014-04-18 – 2014-04-20 (×3): via INTRAVENOUS

## 2014-04-18 MED ORDER — VITAMIN D3 25 MCG (1000 UNIT) PO TABS
2000.0000 [IU] | ORAL_TABLET | Freq: Every day | ORAL | Status: DC
  Administered 2014-04-19 – 2014-04-22 (×4): 2000 [IU] via ORAL
  Filled 2014-04-18 (×5): qty 2

## 2014-04-18 MED ORDER — GLIPIZIDE ER 5 MG PO TB24
5.0000 mg | ORAL_TABLET | Freq: Every day | ORAL | Status: DC
  Administered 2014-04-19 – 2014-04-22 (×4): 5 mg via ORAL
  Filled 2014-04-18 (×5): qty 1

## 2014-04-18 MED ORDER — LIDOCAINE HCL 1 % IJ SOLN
INTRAMUSCULAR | Status: AC
Start: 2014-04-18 — End: 2014-04-19
  Filled 2014-04-18: qty 20

## 2014-04-18 MED ORDER — METOPROLOL TARTRATE 1 MG/ML IV SOLN
5.0000 mg | INTRAVENOUS | Status: DC | PRN
  Filled 2014-04-18: qty 5

## 2014-04-18 SURGICAL SUPPLY — 66 items
BANDAGE ELASTIC 4 VELCRO ST LF (GAUZE/BANDAGES/DRESSINGS) ×3 IMPLANT
BANDAGE ELASTIC 6 VELCRO ST LF (GAUZE/BANDAGES/DRESSINGS) ×3 IMPLANT
BANDAGE ESMARK 6X9 LF (GAUZE/BANDAGES/DRESSINGS) IMPLANT
BIT DRILL CALIBRATED 4.3MMX365 (DRILL) IMPLANT
BIT DRILL CROWE PNT TWST 4.5MM (DRILL) IMPLANT
BNDG CMPR 9X6 STRL LF SNTH (GAUZE/BANDAGES/DRESSINGS)
BNDG COHESIVE 4X5 TAN STRL (GAUZE/BANDAGES/DRESSINGS) ×3 IMPLANT
BNDG ESMARK 6X9 LF (GAUZE/BANDAGES/DRESSINGS)
BNDG GAUZE ELAST 4 BULKY (GAUZE/BANDAGES/DRESSINGS) ×3 IMPLANT
BRUSH SCRUB DISP (MISCELLANEOUS) ×6 IMPLANT
COVER MAYO STAND STRL (DRAPES) ×3 IMPLANT
COVER SURGICAL LIGHT HANDLE (MISCELLANEOUS) ×6 IMPLANT
DRAPE C-ARM 42X72 X-RAY (DRAPES) ×3 IMPLANT
DRAPE C-ARMOR (DRAPES) ×3 IMPLANT
DRAPE IMP U-DRAPE 54X76 (DRAPES) ×3 IMPLANT
DRAPE INCISE IOBAN 66X45 STRL (DRAPES) ×3 IMPLANT
DRAPE ORTHO SPLIT 77X108 STRL (DRAPES) ×6
DRAPE SURG ORHT 6 SPLT 77X108 (DRAPES) ×2 IMPLANT
DRAPE U-SHAPE 47X51 STRL (DRAPES) ×3 IMPLANT
DRILL CALIBRATED 4.3MMX365 (DRILL) ×3
DRILL CROWE POINT TWIST 4.5MM (DRILL) ×3
DRSG ADAPTIC 3X8 NADH LF (GAUZE/BANDAGES/DRESSINGS) ×2 IMPLANT
DRSG PAD ABDOMINAL 8X10 ST (GAUZE/BANDAGES/DRESSINGS) ×2 IMPLANT
ELECT REM PT RETURN 9FT ADLT (ELECTROSURGICAL) ×3
ELECTRODE REM PT RTRN 9FT ADLT (ELECTROSURGICAL) ×1 IMPLANT
EVACUATOR 1/8 PVC DRAIN (DRAIN) IMPLANT
GAUZE SPONGE 4X4 12PLY STRL (GAUZE/BANDAGES/DRESSINGS) ×3 IMPLANT
GAUZE XEROFORM 1X8 LF (GAUZE/BANDAGES/DRESSINGS) ×3 IMPLANT
GLOVE BIO SURGEON STRL SZ7.5 (GLOVE) ×5 IMPLANT
GLOVE BIO SURGEON STRL SZ8 (GLOVE) ×6 IMPLANT
GLOVE BIOGEL PI IND STRL 7.5 (GLOVE) ×1 IMPLANT
GLOVE BIOGEL PI IND STRL 8 (GLOVE) ×1 IMPLANT
GLOVE BIOGEL PI INDICATOR 7.5 (GLOVE) ×2
GLOVE BIOGEL PI INDICATOR 8 (GLOVE) ×2
GOWN STRL REUS W/ TWL LRG LVL3 (GOWN DISPOSABLE) ×2 IMPLANT
GOWN STRL REUS W/ TWL XL LVL3 (GOWN DISPOSABLE) ×1 IMPLANT
GOWN STRL REUS W/TWL LRG LVL3 (GOWN DISPOSABLE) ×6
GOWN STRL REUS W/TWL XL LVL3 (GOWN DISPOSABLE) ×3
GUIDEWIRE BEAD TIP (WIRE) ×3 IMPLANT
KIT BASIN OR (CUSTOM PROCEDURE TRAY) ×3 IMPLANT
KIT ROOM TURNOVER OR (KITS) ×3 IMPLANT
NAIL FEM RETRO 12X360 (Nail) ×2 IMPLANT
PACK ORTHO EXTREMITY (CUSTOM PROCEDURE TRAY) ×3 IMPLANT
PACK UNIVERSAL I (CUSTOM PROCEDURE TRAY) ×3 IMPLANT
PAD ARMBOARD 7.5X6 YLW CONV (MISCELLANEOUS) ×6 IMPLANT
SCREW CORT TI DBL LEAD 5X30 (Screw) ×2 IMPLANT
SCREW CORT TI DBL LEAD 5X34 (Screw) ×2 IMPLANT
SCREW CORT TI DBL LEAD 5X56 (Screw) ×2 IMPLANT
SCREW CORT TI DBL LEAD 5X65 (Screw) ×2 IMPLANT
SCREW CORT TI DBL LEAD 5X80 (Screw) ×2 IMPLANT
SCREW CORT TI DBLE LEAD 5X54 (Screw) ×2 IMPLANT
SPONGE LAP 18X18 X RAY DECT (DISPOSABLE) ×3 IMPLANT
STAPLER VISISTAT 35W (STAPLE) ×3 IMPLANT
STOCKINETTE IMPERVIOUS LG (DRAPES) ×3 IMPLANT
SUT ETHILON 3 0 PS 1 (SUTURE) ×6 IMPLANT
SUT PROLENE 3 0 PS 2 (SUTURE) IMPLANT
SUT VIC AB 0 CT1 27 (SUTURE)
SUT VIC AB 0 CT1 27XBRD ANBCTR (SUTURE) IMPLANT
SUT VIC AB 2-0 CT1 27 (SUTURE) ×3
SUT VIC AB 2-0 CT1 TAPERPNT 27 (SUTURE) ×1 IMPLANT
SUT VIC AB 2-0 CT3 27 (SUTURE) IMPLANT
TOWEL OR 17X24 6PK STRL BLUE (TOWEL DISPOSABLE) ×3 IMPLANT
TOWEL OR 17X26 10 PK STRL BLUE (TOWEL DISPOSABLE) ×8 IMPLANT
TUBE CONNECTING 12'X1/4 (SUCTIONS) ×1
TUBE CONNECTING 12X1/4 (SUCTIONS) ×2 IMPLANT
YANKAUER SUCT BULB TIP NO VENT (SUCTIONS) ×3 IMPLANT

## 2014-04-18 NOTE — Anesthesia Preprocedure Evaluation (Addendum)
Anesthesia Evaluation  Patient identified by MRN, date of birth, ID band Patient awake    Reviewed: Allergy & Precautions, H&P , NPO status , Patient's Chart, lab work & pertinent test results  Airway Mallampati: II  TM Distance: >3 FB Neck ROM: Full    Dental no notable dental hx. (+) Edentulous Upper, Edentulous Lower, Dental Advisory Given   Pulmonary neg pulmonary ROS,  breath sounds clear to auscultation  Pulmonary exam normal       Cardiovascular hypertension, Pt. on medications + Peripheral Vascular Disease Rhythm:Regular Rate:Normal     Neuro/Psych Mental retardation  negative neurological ROS  negative psych ROS   GI/Hepatic negative GI ROS, Neg liver ROS,   Endo/Other  diabetes, Type 2, Oral Hypoglycemic Agents  Renal/GU negative Renal ROS  negative genitourinary   Musculoskeletal   Abdominal   Peds  Hematology negative hematology ROS (+)   Anesthesia Other Findings   Reproductive/Obstetrics negative OB ROS                           Anesthesia Physical Anesthesia Plan  ASA: II  Anesthesia Plan: General   Post-op Pain Management:    Induction: Intravenous  Airway Management Planned: Oral ETT  Additional Equipment:   Intra-op Plan:   Post-operative Plan: Extubation in OR  Informed Consent: I have reviewed the patients History and Physical, chart, labs and discussed the procedure including the risks, benefits and alternatives for the proposed anesthesia with the patient or authorized representative who has indicated his/her understanding and acceptance.   Dental advisory given  Plan Discussed with: CRNA  Anesthesia Plan Comments:         Anesthesia Quick Evaluation

## 2014-04-18 NOTE — Progress Notes (Signed)
[facility]   Pt down in IR getting IVC filter Brother, sister-in-law and pastor in room  Discussed ortho issues in great detail with family   Pt has been living with them for several years Has not been ambulatory for 6 or 7 years but does bear weight to do transfers  Being treated for DVT in R leg, on xarelto Takes vitamin D daily as well, 2000 IU daily vitamin D 3   Pt sustained fall this afternoon while getting up from elevated bathroom chair   Family agrees to proceed with surgery   Full consult and exam to follow   Mearl LatinKeith W. Paul, PA-C [facility] [phone] (P) 04/18/2014 6:09 PM   I have seen and examined the patient. I agree with the findings above.  The risks and benefits of surgery, including the possibility of bleeding, malunion, nonunion, infection, nerve injury, vessel injury, wound breakdown, arthritis, symptomatic hardware, DVT/ PE, loss of motion, and need for further surgery among others were discussed with her brother, who is also her POA.  He understood these risks and wished to proceed.   Budd PalmerHANDY,Isaic Syler H, MD 04/19/2014 12:18 AM

## 2014-04-18 NOTE — Sedation Documentation (Signed)
Pt  Opening her eyes intermittently and moving head a bit, reassurance given. No pain noted.

## 2014-04-18 NOTE — Sedation Documentation (Signed)
No moaning or facial grimacing noted.

## 2014-04-18 NOTE — H&P (Signed)
[facility] History and Physical                                                                                    Crystal Robinson, is a 74 y.o. female  MRN: [id number]   DOB - 11/30/1940  Admit Date - 04/18/2014  Outpatient Primary MD for the patient is OSEI-BONSU,GEORGE, MD  With History of -  Past Medical History  Diagnosis Date  . Mental retardation   . Diabetes mellitus without complication   . Hypertension   . Hypercholesterolemia   . DM (diabetes mellitus), type 2 03/28/2014  . Hyperlipidemia 03/28/2014  . DVT (deep venous thrombosis)       Past Surgical History  Procedure Laterality Date  . Abdominal hysterectomy    . Fracture surgery      in for   Chief Complaint  Patient presents with  . Fall  . Leg Injury     HPI  Crystal Robinson  is a 74 y.o. female, with a past medical history of developmental disability, diabetes, hypertension as well as a recent diagnosis of extensive right lower extremity DVT on Xarelto who was sent to the ER from her home after experiencing a fall from her wheelchair. This occurred during a wheelchair transfer. The patient initially collapsed to her knees seemed to be okay but immediately after going to her knee she leaned backwards while turning her head; family nearby heard a pop that emanated from the right leg area. The patient was endorsing pain in the right leg. She was transferred by EMS, she required Fentanyl  for pain en route. Evaluation in the ER revealed a normotensive patient who was not hypoxic. Right femur x-ray revealed a severely comminuted and impacted/angulated distal right femoral shaft fracture extending to the metadiaphysis. ER physician contacted Dr. Carola FrostHandy with orthopedics. Portable chest x-ray revealed mild bibasilar atelectasis without any acute findings.  Review of Systems   In addition to the HPI above:  No Fever-chills, No Headache, No changes with Vision or hearing, No problems swallowing food or  Liquids, No Chest pain, Cough or Shortness of Breath, No Abdominal pain, No Nausea or Vomiting, Bowel movements are regular, No Blood in stool or Urine, No dysuria, No new skin rashes or bruises, No new joints pains-aches, but does have acute pain left femoral region with deformity No new weakness, tingling, numbness in any extremity, No recent weight gain or loss, No polyuria, polydypsia or polyphagia, A full 10 point Review of Systems was done, except as stated above, all other Review of Systems were negative.  Social History History  Substance Use Topics  . Smoking status: Never Smoker   . Smokeless tobacco: Never Used  . Alcohol Use: No    Family History No significant family medical history such as coronary artery disease, malignancies or coagulopathic disorders  Prior to Admission medications   Medication Sig Start Date End Date Taking? Authorizing Provider  acetaminophen (TYLENOL) 650 MG CR tablet Take 1,300 mg by mouth daily as needed for pain (joint pain).    Historical Provider, MD  Cholecalciferol (VITAMIN D3) 2000 UNITS TABS Take 1 tablet by mouth daily.  Historical Provider, MD  diphenhydramine-acetaminophen (TYLENOL PM) 25-500 MG TABS Take 3 tablets by mouth at bedtime as needed (sleep).    Historical Provider, MD  fenofibrate (TRICOR) 145 MG tablet Take 145 mg by mouth daily.    Historical Provider, MD  Garlic 1000 MG CAPS Take 1,000 mg by mouth daily.    Historical Provider, MD  glipiZIDE (GLUCOTROL XL) 5 MG 24 hr tablet Take 1 tablet (5 mg total) by mouth daily. 01/22/12   Twana First Hess, DO  hydrochlorothiazide (HYDRODIURIL) 25 MG tablet Take 1 tablet (25 mg total) by mouth daily. 01/22/12   Briscoe Deutscher, DO  Multiple Vitamins-Minerals (MULTIVITAMIN & MINERAL PO) Take 1 tablet by mouth daily.    Historical Provider, MD  pantoprazole (PROTONIX) 40 MG tablet Take 1 tablet (40 mg total) by mouth daily at 6 (six) AM. 03/31/14   Rodolph Bong, MD  ramipril (ALTACE) 10  MG capsule Take 1 capsule (10 mg total) by mouth daily. 01/22/12   Briscoe Deutscher, DO  Rivaroxaban (XARELTO STARTER PACK) 15 & 20 MG TBPK Take as directed on package: Start with one 15mg  tablet by mouth twice a day with food. On Day 22, switch to one 20mg  tablet once a day with food. 03/30/14   Rodolph Bong, MD  rivaroxaban (XARELTO) 20 MG TABS tablet Take 1 tablet (20 mg total) by mouth daily with supper. 04/28/14   Rodolph Bong, MD  vitamin C (ASCORBIC ACID) 500 MG tablet Take 500 mg by mouth daily.    Historical Provider, MD  vitamin E 400 UNIT capsule Take 400 Units by mouth daily.    Historical Provider, MD    No Known Allergies  Physical Exam  Vitals  Blood pressure 129/76, pulse 99, temperature 97.7 F (36.5 C), temperature source Oral, resp. rate 22, SpO2 95 %.   General:  lying in bed in NAD, recently medicated for pain,   Psych:  Affect appropriate for developmentally delayed female, able to respond appropriately with one word responses to questions asked  Neuro:   No F.N deficits, ALL C.Nerves Intact, Strength 5/5 all 4 extremities, Sensation intact all 4 extremities.  ENT:  Ears and Eyes appear Normal, Conjunctivae clear, PER. Moist oral mucosa without erythema or exudates.  Neck:  Supple, No lymphadenopathy appreciated  Respiratory:  Symmetrical chest wall movement, Good air movement bilaterally, CTAB. Room Air  Cardiac:  RRR, No Murmurs, no LE edema noted, no JVD.  Peripheral pulses easily palpable 2+ bilaterally, no carotid bruits  Abdomen:  Positive bowel sounds, Soft, Non tender, Non distended,  No masses appreciated  Skin:  No Cyanosis, Normal Skin Turgor, No Skin Rash or Bruise.  Extremities:  Weakly moves all extremities strength estimated 4/5 except for right lower extremity which she is not moving except for limited right foot movement secondary to pain and recent femur fracture  Data Review  CBC  Recent Labs Lab 04/18/14 1135  WBC 11.5*  HGB  14.2  HCT 41.7  PLT 313  MCV 88.7  MCH 30.2  MCHC 34.1  RDW 13.1  LYMPHSABS 1.3  MONOABS 0.5  EOSABS 0.2  BASOSABS 0.1    Chemistries   Recent Labs Lab 04/18/14 1135  NA 144  K 2.9*  CL 105  CO2 31  GLUCOSE 164*  BUN 10  CREATININE 0.87  CALCIUM 10.1  AST 31  ALT 22  ALKPHOS 57  BILITOT 1.0    CrCl cannot be calculated (Unknown ideal weight.).  No  results for input(s): TSH, T4TOTAL, T3FREE, THYROIDAB in the last 72 hours.  Invalid input(s): FREET3  Coagulation profile No results for input(s): INR, PROTIME in the last 168 hours.  No results for input(s): DDIMER in the last 72 hours.  Cardiac Enzymes No results for input(s): CKMB, TROPONINI, MYOGLOBIN in the last 168 hours.  Invalid input(s): CK  Invalid input(s): POCBNP  Urinalysis No results found for: COLORURINE, APPEARANCEUR, LABSPEC, PHURINE, GLUCOSEU, HGBUR, BILIRUBINUR, KETONESUR, PROTEINUR, UROBILINOGEN, NITRITE, LEUKOCYTESUR  Imaging results:   Dg Chest 1 View  04/18/2014   CLINICAL DATA:  Fall today from wheelchair. Leg injury. Mentally handicapped. Initial encounter.  EXAM: CHEST - 1 VIEW  COMPARISON:  None.  FINDINGS: 1202 hr. There is mild patient rotation to the right. Allowing for this, the heart size and mediastinal contours are normal There is mild atelectasis at both lung bases, but no confluent airspace opacity, pneumothorax or significant pleural effusion. No acute fractures identified. Telemetry leads overlie the chest.  IMPRESSION: Mild bibasilar atelectasis.  No definite acute findings.   Electronically Signed   By: Roxy Horseman M.D.   On: 04/18/2014 12:39   Dg Femur, Min 2 Views Right  04/18/2014   CLINICAL DATA:  74 year old female who fell while transferring from wheelchair. Initial encounter.  EXAM: DG FEMUR 2+V*R*  COMPARISON:  None.  FINDINGS: Comminuted, impacted, and angulated fracture of the distal right femoral shaft. Overriding of fragments up to 6 cm. Posterior displacement and  angulation of the distal fragment. The fracture plane extends to the distal right femur metadiaphysis.  Proximal right femur appears to remain intact. Right knee not entirely included.  IMPRESSION: Severely comminuted, impacted, and angulated distal right femoral shaft fracture extending to the metadiaphysis.   Electronically Signed   By: Augusto Gamble M.D.   On: 04/18/2014 12:40    My personal review of EKG: Sinus tachycardia without any obvious ischemic changes   Assessment & Plan  Principal Problem:   Right femoral fracture/comminuted -Orthopedic surgery evaluation pending; anticipate will need surgical intervention -Patient high risk for surgical procedure but benefit outweighs risk given severity of fracture and likelihood if no surgical intervention pursued patient would be permanently nonambulatory-risk vs benefits discuss with the patient's family given her underlying mentation -Anticipate will need extensive physical rehabilitation postoperatively -Provide adequate analgesia  Active Problems:   Dvt femoral on anticoagulation -Recent admission for same with discharge on 1/16 -Hold Xarelto in favor of IV heparin, pharmacy to dose -Since extensive DVT will need a retrievable IVC filter placed by interventional radiology until anticoagulation can be safely resumed in the postoperative period noting intraoperatively will need to be off anticoagulation    Mental retardation -Appears at baseline and no evidence of anxiety or combative behaviors -Needs Foley catheter inserted but will place catheter in the OR; may play sooner if OR delayed    HYPERTENSION, BENIGN SYSTEMIC -Current blood pressure controlled -Continue home medications but consider holding thiazide diuretic      Diabetes Mellitus, type 2 -Current CBGs well-controlled -Continue Glucotrol but hold when nothing by mouth -Check CBGs before meals and hour of sleep and provide sliding scale coverage   Hypokalemia -IV replete  and follow labs    Hyperlipidemia -Continue [facility]    DVT Prophylaxis: IV heparin per pharmacy  Family Communication:  Parents at bedside  Code Status:  Full  Condition:  Stable but guarded  Time spent in minutes : 60   ELLIS,ALLISON L. ANP on 04/18/2014 at 1:19 PM  Between 7am  to 7pm - Pager - [phone]  After 7pm go to [email or web address] - password [email or web address]  And look for the night coverage person covering me after hours  [facility]

## 2014-04-18 NOTE — ED Notes (Signed)
Attempted report 

## 2014-04-18 NOTE — Consult Note (Signed)
Reason for consult: IVC filter  Referring Physician(s): [facility]  History of Present Illness: Crystal Robinson is a 74 y.o. female with a past medical history of developmental disability, diabetes, hypertension as well as a recent diagnosis of extensive right lower extremity DVT (thrombosis involving the right common femoral vein, right profunda femoris vein, right femoral vein, right popliteal vein, right posterial tibial vein, and right peroneal vein) on Xarelto who was sent to the ER from her home after experiencing a fall from her wheelchair. This occurred during a wheelchair transfer. The patient initially collapsed to her knees seemed to be okay but immediately after going to her knee she leaned backwards while turning her head; family nearby heard a pop that emanated from the right leg area. Subsequent imaging revealed severely comminuted, impacted, and angulated distal right femoral shaft fracture extending to the metadiaphysis. Pt is tent scheduled for OR later today and request now received for IVC filter placement preop.  Past Medical History  Diagnosis Date  . Mental retardation   . Diabetes mellitus without complication   . Hypertension   . Hypercholesterolemia   . DM (diabetes mellitus), type 2 03/28/2014  . Hyperlipidemia 03/28/2014  . DVT (deep venous thrombosis)     Past Surgical History  Procedure Laterality Date  . Abdominal hysterectomy    . Fracture surgery      Allergies: Review of patient's allergies indicates no known allergies.  Medications: Prior to Admission medications   Medication Sig Start Date End Date Taking? Authorizing Provider  acetaminophen (TYLENOL) 650 MG CR tablet Take 1,300 mg by mouth daily.    Yes Historical Provider, MD  Cholecalciferol (VITAMIN D3) 2000 UNITS TABS Take 1 tablet by mouth daily.   Yes Historical Provider, MD  diphenhydramine-acetaminophen (TYLENOL PM) 25-500 MG TABS Take 3 tablets by mouth at bedtime.    Yes Historical Provider,  MD  fenofibrate (TRICOR) 145 MG tablet Take 145 mg by mouth daily.   Yes Historical Provider, MD  Garlic 1000 MG CAPS Take 1,000 mg by mouth daily.   Yes Historical Provider, MD  glipiZIDE (GLUCOTROL XL) 5 MG 24 hr tablet Take 1 tablet (5 mg total) by mouth daily. 01/22/12  Yes Bryan R Hess, DO  hydrochlorothiazide (HYDRODIURIL) 25 MG tablet Take 1 tablet (25 mg total) by mouth daily. 01/22/12  Yes Bryan R Hess, DO  Multiple Vitamins-Minerals (MULTIVITAMIN & MINERAL PO) Take 1 tablet by mouth daily.   Yes Historical Provider, MD  ramipril (ALTACE) 10 MG capsule Take 1 capsule (10 mg total) by mouth daily. 01/22/12  Yes Bryan R Hess, DO  Rivaroxaban (XARELTO STARTER PACK) 15 & 20 MG TBPK Take as directed on package: Start with one  tablet by mouth twice a day with food. On Day 22, switch to one  tablet once a day with food. 03/30/14  Yes Rodolph Bong, MD  rivaroxaban (XARELTO) 20 MG TABS tablet Take 1 tablet (20 mg total) by mouth daily with supper. 04/28/14  Yes Rodolph Bong, MD  vitamin C (ASCORBIC ACID) 500 MG tablet Take 500 mg by mouth daily.   Yes Historical Provider, MD  vitamin E 400 UNIT capsule Take 400 Units by mouth daily.   Yes Historical Provider, MD  pantoprazole (PROTONIX) 40 MG tablet Take 1 tablet (40 mg total) by mouth daily at 6 (six) AM. Patient not taking: Reported on 04/18/2014 03/31/14   Rodolph Bong, MD    History reviewed. No pertinent family history.  History  Social History  . Marital Status: Single    Spouse Name: N/A    Number of Children: N/A  . Years of Education: N/A   Social History Main Topics  . Smoking status: Never Smoker   . Smokeless tobacco: Never Used  . Alcohol Use: No  . Drug Use: No  . Sexual Activity: None   Other Topics Concern  . None   Social History Narrative     Review of Systems per family denies fevers, HA,CP, dyspnea, cough, abd/back pain, N/V  Vital Signs: BP 118/77 mmHg  Pulse 96  Temp(Src) 97.7 F  (36.5 C) (Oral)  Resp 13  Ht 5\' 6"  (1.676 m)  Wt 178 lb (80.74 kg)  BMI 28.74 kg/m2  SpO2 95%  Physical Exam Pt awake, chest- CTA bilat; heart- RRR; abd- soft,+BS,NT; ext- no sig edema, feet warm to touch, intact distal pulses, rt fem shaft fx with tip of bone close prox to skin surface  Imaging: Dg Chest 1 View  04/18/2014   CLINICAL DATA:  Fall today from wheelchair. Leg injury. Mentally handicapped. Initial encounter.  EXAM: CHEST - 1 VIEW  COMPARISON:  None.  FINDINGS: 1202 hr. There is mild patient rotation to the right. Allowing for this, the heart size and mediastinal contours are normal There is mild atelectasis at both lung bases, but no confluent airspace opacity, pneumothorax or significant pleural effusion. No acute fractures identified. Telemetry leads overlie the chest.  IMPRESSION: Mild bibasilar atelectasis.  No definite acute findings.   Electronically Signed   By: Roxy HorsemanBill  Veazey M.D.   On: 04/18/2014 12:39   Dg Femur, Min 2 Views Right  04/18/2014   CLINICAL DATA:  74 year old female who fell while transferring from wheelchair. Initial encounter.  EXAM: DG FEMUR 2+V*R*  COMPARISON:  None.  FINDINGS: Comminuted, impacted, and angulated fracture of the distal right femoral shaft. Overriding of fragments up to 6 cm. Posterior displacement and angulation of the distal fragment. The fracture plane extends to the distal right femur metadiaphysis.  Proximal right femur appears to remain intact. Right knee not entirely included.  IMPRESSION: Severely comminuted, impacted, and angulated distal right femoral shaft fracture extending to the metadiaphysis.   Electronically Signed   By: Augusto GambleLee  Hall M.D.   On: 04/18/2014 12:40    Labs:  CBC:  Recent Labs  03/29/14 0025 03/30/14 0510 03/31/14 0513 04/18/14 1135  WBC 8.4 10.4 6.4 11.5*  HGB 13.5 13.0 13.4 14.2  HCT 41.3 38.9 40.1 41.7  PLT 252 230 273 313    COAGS:  Recent Labs  03/28/14 1703 03/28/14 2015 03/29/14 0025  INR 1.09  1.13 1.10  APTT 33 37  --     BMP:  Recent Labs  03/29/14 0025 03/30/14 0510 03/31/14 0513 04/18/14 1135  NA 138 139 137 144  K 4.0 3.6 3.7 2.9*  CL 105 105 102 105  CO2 28 23 24 31   GLUCOSE 133* 136* 142* 164*  BUN 14 16 18 10   CALCIUM 10.3 9.7 9.8 10.1  CREATININE 0.75 0.75 0.80 0.87  GFRNONAA 83* 83* 72* 65*  GFRAA >90 >90 84* 76*    LIVER FUNCTION TESTS:  Recent Labs  03/28/14 2015 04/18/14 1135  BILITOT 1.0 1.0  AST 25 31  ALT 17 22  ALKPHOS 61 57  PROT 7.2 6.1  ALBUMIN 3.7 3.4*    TUMOR MARKERS: No results for input(s): AFPTM, CEA, CA199, CHROMGRNA in the last 8760 hours.  Assessment and Plan: Crystal Robinson is a  74 y.o. female with a past medical history of developmental disability, diabetes, hypertension as well as a recent diagnosis of extensive right lower extremity DVT (thrombosis involving the right common femoral vein, right profunda femoris vein, right femoral vein, right popliteal vein, right posterial tibial vein, and right peroneal vein) on Xarelto who was sent to the ER from her home after experiencing a fall from her wheelchair. This occurred during a wheelchair transfer. The patient initially collapsed to her knees seemed to be okay but immediately after going to her knee she leaned backwards while turning her head; family nearby heard a pop that emanated from the right leg area. Subsequent imaging revealed severely comminuted, impacted, and angulated distal right femoral shaft fracture extending to the metadiaphysis. Pt is tent scheduled for OR later today and request now received for IVC filter placement preop. Details/risks of procedure d/w pt's family with their understanding and consent.   Signed: Chinita Pester 04/18/2014, 2:52 PM   I spent a total of 20 minutes face to face in clinical consultation, greater than 50% of which was counseling/coordinating care for IVC filter placement

## 2014-04-18 NOTE — Procedures (Signed)
Placement of IVC [place] filter.  Placed below the renal veins.  No immediate complication.  See Imaging report.

## 2014-04-18 NOTE — Progress Notes (Cosign Needed)
Received call from orthopedic physician assistant. Plan is to proceed with surgical intervention to repair her hip fracture tonight sometime after 6 PM. Discussed with the PA that patient will need IVC filter placed preoperatively. Notified interventional radiology that patient going for surgery sometime after 6 PM today and will need urgent placement of IVC filter. I was told the patient would likely go for this procedure sometime after 3 PM today.  Junious SilkAllison Kieu Quiggle, ANP

## 2014-04-18 NOTE — Sedation Documentation (Signed)
A few PVCs, couplet, and PAC noted w/ advancement of catheter. Dr. Lowella DandyHenn notified. Resolved quickly back to NSR w/ no ectopy.

## 2014-04-18 NOTE — Progress Notes (Signed)
ANTICOAGULATION CONSULT NOTE - Initial Consult  Pharmacy Consult for heparin Indication: DVT  No Known Allergies  Patient Measurements: IBW: 59.3 kg Heparin Dosing Weight: 76 kg  Vital Signs: Temp: 97.7 F (36.5 C) (02/03 1137) Temp Source: Oral (02/03 1137) BP: 130/57 mmHg (02/03 1330) Pulse Rate: 97 (02/03 1330)  Labs:  Recent Labs  04/18/14 1135  HGB 14.2  HCT 41.7  PLT 313  CREATININE 0.87    CrCl cannot be calculated (Unknown ideal weight.).   Medical History: Past Medical History  Diagnosis Date  . Mental retardation   . Diabetes mellitus without complication   . Hypertension   . Hypercholesterolemia   . DM (diabetes mellitus), type 2 03/28/2014  . Hyperlipidemia 03/28/2014  . DVT (deep venous thrombosis)     Medications:   (Not in a hospital admission)  Assessment: 74 yo female admitted with R femoral fracture. Pt was on Xarelto PTA for extensive DVT found during previous admission in Jan 2016.  She is still taking 15 mg po bid with plan to transition to 20 mg po once daily on Saturday 04/21/14. Her last dose of Xarelto was yesterday evening. Current CBC stable and wnl. Anticipate IVC filter placement today and surgical intervention for femoral fracture this evening.   Goal of Therapy:  Heparin level 0.3-0.7 units/ml Monitor platelets by anticoagulation protocol: Yes   Plan:  Heparin bolus 4000 units x1, followed by 1200 units/hr Daily HL, CBC F/u transition to PO anticoag F/u heparin plan during peri-operative setting   Agapito GamesAlison Rokia Bosket, PharmD, BCPS Clinical Pharmacist Pager: [phone] 04/18/2014 1:57 PM

## 2014-04-18 NOTE — ED Provider Notes (Addendum)
CSN: [id number]     Arrival date & time 04/18/14  1111 History   First MD Initiated Contact with Patient 04/18/14 1123     Chief Complaint  Patient presents with  . Fall  . Leg Injury     (Consider location/radiation/quality/duration/timing/severity/associated sxs/prior Treatment) HPI  Crystal Robinson is a 74 y.o. female who presents for evaluation of right upper leg injury.  Patient was with a caregiver, when she collapsed to her knees.  She did not injure herself.  At that time, but when she leaned back to locate someone a pop was heard from her right leg.  After that she had pain in the right leg.  She presents by EMS for evaluation after treatment with fentanyl.  The patient is unable to give history.  Level V Caveat- altered mental status   Past Medical History  Diagnosis Date  . Mental retardation   . Diabetes mellitus without complication   . Hypertension   . Hypercholesterolemia   . DM (diabetes mellitus), type 2 03/28/2014  . Hyperlipidemia 03/28/2014  . DVT (deep venous thrombosis)    Past Surgical History  Procedure Laterality Date  . Abdominal hysterectomy    . Fracture surgery     History reviewed. No pertinent family history. History  Substance Use Topics  . Smoking status: Never Smoker   . Smokeless tobacco: Never Used  . Alcohol Use: No   OB History    No data available     Review of Systems  Unable to perform ROS     Allergies  Review of patient's allergies indicates no known allergies.  Home Medications   Prior to Admission medications   Medication Sig Start Date End Date Taking? Authorizing Provider  acetaminophen (TYLENOL) 650 MG CR tablet Take 1,300 mg by mouth daily as needed for pain (joint pain).    Historical Provider, MD  Cholecalciferol (VITAMIN D3) 2000 UNITS TABS Take 1 tablet by mouth daily.    Historical Provider, MD  diphenhydramine-acetaminophen (TYLENOL PM) 25-500 MG TABS Take 3 tablets by mouth at bedtime as needed (sleep).     Historical Provider, MD  fenofibrate (TRICOR) 145 MG tablet Take 145 mg by mouth daily.    Historical Provider, MD  Garlic 1000 MG CAPS Take 1,000 mg by mouth daily.    Historical Provider, MD  glipiZIDE (GLUCOTROL XL) 5 MG 24 hr tablet Take 1 tablet (5 mg total) by mouth daily. 01/22/12   Twana First Hess, DO  hydrochlorothiazide (HYDRODIURIL) 25 MG tablet Take 1 tablet (25 mg total) by mouth daily. 01/22/12   Briscoe Deutscher, DO  Multiple Vitamins-Minerals (MULTIVITAMIN & MINERAL PO) Take 1 tablet by mouth daily.    Historical Provider, MD  pantoprazole (PROTONIX) 40 MG tablet Take 1 tablet (40 mg total) by mouth daily at 6 (six) AM. 03/31/14   Rodolph Bong, MD  ramipril (ALTACE) 10 MG capsule Take 1 capsule (10 mg total) by mouth daily. 01/22/12   Briscoe Deutscher, DO  Rivaroxaban (XARELTO STARTER PACK) 15 & 20 MG TBPK Take as directed on package: Start with one  tablet by mouth twice a day with food. On Day 22, switch to one  tablet once a day with food. 03/30/14   Rodolph Bong, MD  rivaroxaban (XARELTO) 20 MG TABS tablet Take 1 tablet (20 mg total) by mouth daily with supper. 04/28/14   Rodolph Bong, MD  vitamin C (ASCORBIC ACID) 500 MG tablet Take 500 mg by mouth daily.  Historical Provider, MD  vitamin E 400 UNIT capsule Take 400 Units by mouth daily.    Historical Provider, MD   BP 129/76 mmHg  Pulse 99  Temp(Src) 97.7 F (36.5 C) (Oral)  Resp 22  SpO2 95% Physical Exam  Constitutional: She appears well-developed.  Elderly, frail  HENT:  Head: Normocephalic and atraumatic.  Right Ear: External ear normal.  Left Ear: External ear normal.  Eyes: Conjunctivae and EOM are normal. Pupils are equal, round, and reactive to light.  Neck: Normal range of motion and phonation normal. Neck supple.  Cardiovascular: Normal rate, regular rhythm and normal heart sounds.   Pulmonary/Chest: Effort normal and breath sounds normal. She exhibits no bony tenderness.  Abdominal: Soft. There  is no tenderness.  Musculoskeletal: Normal range of motion.  Right leg is held flexed at 45 with an array of splinting material.  There is puckering of the skin over the anterior distal femur, without skin injury.  There is tenderness and swelling at the site.  Neurovascularly intact distally in the right foot.  Movement of the right lower leg causes right thigh pain.  Neurological: She is alert. No cranial nerve deficit or sensory deficit. She exhibits normal muscle tone. Coordination normal.  Skin: Skin is warm, dry and intact.  Psychiatric: She has a normal mood and affect. Her behavior is normal.  Nursing note and vitals reviewed.   ED Course  Procedures (including critical care time)   Medications  0.9 %  sodium chloride infusion (not administered)  fentaNYL (SUBLIMAZE) injection 100 mcg (100 mcg Intravenous Given 04/18/14 1307)  potassium chloride 10 mEq in 100 mL IVPB (10 mEq Intravenous New Bag/Given 04/18/14 1316)  insulin aspart (novoLOG) injection 0-15 Units (not administered)  fentaNYL (SUBLIMAZE) injection 50 mcg (50 mcg Intravenous Given 04/18/14 1147)  ondansetron (ZOFRAN) injection 4 mg (4 mg Intravenous Given 04/18/14 1146)  sodium chloride 0.9 % bolus 500 mL (0 mLs Intravenous Stopped 04/18/14 1310)    Patient Vitals for the past 24 hrs:  BP Temp Temp src Pulse Resp SpO2  04/18/14 1224 129/76 mmHg - - 99 22 95 %  04/18/14 1137 132/61 mmHg 97.7 F (36.5 C) Oral 94 22 99 %  04/18/14 1111 - - - - - 96 %   12:57- consult, with orthopedic surgery, Dr. Carola Frost will see the patient and plans on operative repair.  He would like the hospitalist to admit the patient   1:03 PM Reevaluation with update and discussion. After initial assessment and treatment, an updated evaluation reveals she is fairly comfortable at this time.  According to her caregiver, her last oral intake was yesterday evening.  Findings discussed with caregiver, all questions answered. Crystal Robinson   1:07  PM-Consult complete with Dr. Sedonia Small. Patient case explained and discussed. He agrees to admit patient for further evaluation and treatment. Call ended at 13:14  Labs Review Labs Reviewed  COMPREHENSIVE METABOLIC PANEL - Abnormal; Notable for the following:    Potassium 2.9 (*)    Glucose, Bld 164 (*)    Albumin 3.4 (*)    GFR calc non Af Amer 65 (*)    GFR calc Af Amer 76 (*)    All other components within normal limits  CBC WITH DIFFERENTIAL/PLATELET - Abnormal; Notable for the following:    WBC 11.5 (*)    Neutrophils Relative % 82 (*)    Neutro Abs 9.5 (*)    Lymphocytes Relative 11 (*)    All other components within normal limits  URINE CULTURE  URINALYSIS, ROUTINE W REFLEX MICROSCOPIC  HEMOGLOBIN A1C  SAMPLE TO BLOOD BANK    Imaging Review Dg Chest 1 View  04/18/2014   CLINICAL DATA:  Fall today from wheelchair. Leg injury. Mentally handicapped. Initial encounter.  EXAM: CHEST - 1 VIEW  COMPARISON:  None.  FINDINGS: 1202 hr. There is mild patient rotation to the right. Allowing for this, the heart size and mediastinal contours are normal There is mild atelectasis at both lung bases, but no confluent airspace opacity, pneumothorax or significant pleural effusion. No acute fractures identified. Telemetry leads overlie the chest.  IMPRESSION: Mild bibasilar atelectasis.  No definite acute findings.   Electronically Signed   By: Roxy HorsemanBill  Veazey M.D.   On: 04/18/2014 12:39   Dg Femur, Min 2 Views Right  04/18/2014   CLINICAL DATA:  74 year old female who fell while transferring from wheelchair. Initial encounter.  EXAM: DG FEMUR 2+V*R*  COMPARISON:  None.  FINDINGS: Comminuted, impacted, and angulated fracture of the distal right femoral shaft. Overriding of fragments up to 6 cm. Posterior displacement and angulation of the distal fragment. The fracture plane extends to the distal right femur metadiaphysis.  Proximal right femur appears to remain intact. Right knee not entirely included.   IMPRESSION: Severely comminuted, impacted, and angulated distal right femoral shaft fracture extending to the metadiaphysis.   Electronically Signed   By: Augusto GambleLee  Hall M.D.   On: 04/18/2014 12:40        EKG Interpretation  Date/Time:  Wednesday April 18 2014 12:56:17 EST Ventricular Rate:  100 PR Interval:  165 QRS Duration: 99 QT Interval:  367 QTC Calculation: 473 R Axis:   51 Text Interpretation:  Sinus tachycardia Atrial premature complexes Low voltage, precordial leads Minimal ST depression, lateral leads Since last tracing rate faster Confirmed by [facility]WENTZ  MD, Mechele CollinELLIOTT ([id number]) on 04/18/2014 2:47:06 PM       MDM   Final diagnoses:  Pain of right leg  Femur fracture, right, closed, initial encounter  Hypokalemia  DVT (deep venous thrombosis), right    Right femur fracture, somewhat unusual mechanism, which will require operative management.  Incidental hypokalemia.  No preceding illness.  No clear mechanism for the fall that initiated the injury.  This may require further evaluation in hospital setting.   Nursing Notes Reviewed/ Care Coordinated, and agree without changes. Applicable Imaging Reviewed.  Interpretation of Laboratory Data incorporated into ED treatment   Plan: Admit    Flint MelterElliott Robinson Dammon Makarewicz, MD 04/18/14 1321  Flint MelterElliott Robinson Chea Malan, MD 04/18/14 [phone]

## 2014-04-18 NOTE — Progress Notes (Signed)
Orthopedic Tech Progress Note Patient Details:  Crystal Robinson 1940/04/05 [id number]  Patient ID: Crystal DeterPatricia Robinson, female   DOB: 1940/04/05, 74 y.o.   MRN: [id number] Pt unable to use trapeze bar patient helper; RN notified  Nikki DomCrawford, Vinicio Lynk 04/18/2014, 3:04 PM

## 2014-04-18 NOTE — ED Notes (Signed)
Pt from home via [facility] with c/o fall while transfering from wheelchair to commode with help from caregiver.  Pt fell bilaterally on her knees, deformity noted to right upper thigh with no bleeding.  Denies hitting head or LOC.  Given 100 mcg fentanyl.  Pt in NAD, alert.  Hx MR and at baseline.

## 2014-04-18 NOTE — Sedation Documentation (Signed)
Pt brought to nursing station, awake and alert, unable to verbalize name or DOB, armband verified and MRN checked w/ second nurse, Marjean DonnaEileen. Pt w/ 2 IVs noted to rt forearm w/ Heparin at 1200units/hr ,and potassium infusing.

## 2014-04-18 NOTE — Sedation Documentation (Signed)
Patient is resting comfortably. 

## 2014-04-19 ENCOUNTER — Inpatient Hospital Stay (HOSPITAL_COMMUNITY): Payer: Medicare Other

## 2014-04-19 ENCOUNTER — Inpatient Hospital Stay (HOSPITAL_COMMUNITY): Payer: Medicare Other | Admitting: Anesthesiology

## 2014-04-19 ENCOUNTER — Encounter (HOSPITAL_COMMUNITY): Payer: Self-pay | Admitting: Orthopedic Surgery

## 2014-04-19 DIAGNOSIS — D62 Acute posthemorrhagic anemia: Secondary | ICD-10-CM

## 2014-04-19 LAB — APTT: APTT: 35 s (ref 24–37)

## 2014-04-19 LAB — CBC
HCT: 34.1 % — ABNORMAL LOW (ref 36.0–46.0)
Hemoglobin: 11.4 g/dL — ABNORMAL LOW (ref 12.0–15.0)
MCH: 30.5 pg (ref 26.0–34.0)
MCHC: 33.4 g/dL (ref 30.0–36.0)
MCV: 91.2 fL (ref 78.0–100.0)
Platelets: 310 10*3/uL (ref 150–400)
RBC: 3.74 MIL/uL — AB (ref 3.87–5.11)
RDW: 13.4 % (ref 11.5–15.5)
WBC: 12 10*3/uL — ABNORMAL HIGH (ref 4.0–10.5)

## 2014-04-19 LAB — GLUCOSE, CAPILLARY
GLUCOSE-CAPILLARY: 147 mg/dL — AB (ref 70–99)
GLUCOSE-CAPILLARY: 227 mg/dL — AB (ref 70–99)
Glucose-Capillary: 187 mg/dL — ABNORMAL HIGH (ref 70–99)
Glucose-Capillary: 238 mg/dL — ABNORMAL HIGH (ref 70–99)
Glucose-Capillary: 225 mg/dL — ABNORMAL HIGH (ref 70–99)

## 2014-04-19 LAB — MAGNESIUM: MAGNESIUM: 1.7 mg/dL (ref 1.5–2.5)

## 2014-04-19 LAB — BASIC METABOLIC PANEL
ANION GAP: 11 (ref 5–15)
BUN: 9 mg/dL (ref 6–23)
CALCIUM: 9.1 mg/dL (ref 8.4–10.5)
CO2: 24 mmol/L (ref 19–32)
Chloride: 103 mmol/L (ref 96–112)
Creatinine, Ser: 0.83 mg/dL (ref 0.50–1.10)
GFR, EST AFRICAN AMERICAN: 80 mL/min — AB (ref >90)
GFR, EST NON AFRICAN AMERICAN: 69 mL/min — AB (ref >90)
GLUCOSE: 197 mg/dL — AB (ref 70–99)
POTASSIUM: 4.4 mmol/L (ref 3.5–5.1)
Sodium: 138 mmol/L (ref 135–145)

## 2014-04-19 LAB — HEMOGLOBIN A1C
HEMOGLOBIN A1C: 6.8 % — AB (ref 4.8–5.6)
Mean Plasma Glucose: 148 mg/dL

## 2014-04-19 LAB — BLOOD PRODUCT ORDER (VERBAL) VERIFICATION

## 2014-04-19 MED ORDER — ENOXAPARIN SODIUM 80 MG/0.8ML ~~LOC~~ SOLN
1.0000 mg/kg | Freq: Two times a day (BID) | SUBCUTANEOUS | Status: DC
  Filled 2014-04-19 (×2): qty 0.8

## 2014-04-19 MED ORDER — GLYCOPYRROLATE 0.2 MG/ML IJ SOLN
INTRAMUSCULAR | Status: AC
  Filled 2014-04-19: qty 3

## 2014-04-19 MED ORDER — ROCURONIUM BROMIDE 100 MG/10ML IV SOLN
INTRAVENOUS | Status: DC | PRN
Start: 2014-04-19 — End: 2014-04-19
  Administered 2014-04-19: 20 mg via INTRAVENOUS
  Administered 2014-04-19: 10 mg via INTRAVENOUS
  Administered 2014-04-19: 20 mg via INTRAVENOUS
  Administered 2014-04-19: 50 mg via INTRAVENOUS

## 2014-04-19 MED ORDER — ARTIFICIAL TEARS OP OINT
TOPICAL_OINTMENT | OPHTHALMIC | Status: AC
  Filled 2014-04-19: qty 3.5

## 2014-04-19 MED ORDER — NEOSTIGMINE METHYLSULFATE 10 MG/10ML IV SOLN
INTRAVENOUS | Status: DC | PRN
  Administered 2014-04-19: 5 mg via INTRAVENOUS

## 2014-04-19 MED ORDER — FENTANYL CITRATE 0.05 MG/ML IJ SOLN
25.0000 ug | INTRAMUSCULAR | Status: DC | PRN
  Administered 2014-04-19 (×2): 25 ug via INTRAVENOUS

## 2014-04-19 MED ORDER — RIVAROXABAN 20 MG PO TABS
20.0000 mg | ORAL_TABLET | Freq: Every day | ORAL | Status: DC
  Administered 2014-04-21: 20 mg via ORAL
  Filled 2014-04-19 (×2): qty 1

## 2014-04-19 MED ORDER — ONDANSETRON HCL 4 MG PO TABS: 4.0000 mg | ORAL_TABLET | Freq: Four times a day (QID) | ORAL | Status: DC | PRN

## 2014-04-19 MED ORDER — PHENOL 1.4 % MT LIQD: 1.0000 | OROMUCOSAL | Status: DC | PRN

## 2014-04-19 MED ORDER — FENTANYL CITRATE 0.05 MG/ML IJ SOLN
INTRAMUSCULAR | Status: DC | PRN
  Administered 2014-04-19: 50 ug via INTRAVENOUS
  Administered 2014-04-19: 25 ug via INTRAVENOUS
  Administered 2014-04-19: 50 ug via INTRAVENOUS
  Administered 2014-04-19: 25 ug via INTRAVENOUS
  Administered 2014-04-19 (×2): 50 ug via INTRAVENOUS

## 2014-04-19 MED ORDER — NEOSTIGMINE METHYLSULFATE 10 MG/10ML IV SOLN
INTRAVENOUS | Status: AC
  Filled 2014-04-19: qty 1

## 2014-04-19 MED ORDER — PHENYLEPHRINE 40 MCG/ML (10ML) SYRINGE FOR IV PUSH (FOR BLOOD PRESSURE SUPPORT)
PREFILLED_SYRINGE | INTRAVENOUS | Status: AC
  Filled 2014-04-19: qty 20

## 2014-04-19 MED ORDER — MENTHOL 3 MG MT LOZG: 1.0000 | LOZENGE | OROMUCOSAL | Status: DC | PRN

## 2014-04-19 MED ORDER — GLYCOPYRROLATE 0.2 MG/ML IJ SOLN
INTRAMUSCULAR | Status: DC | PRN
  Administered 2014-04-19: .7 mg via INTRAVENOUS

## 2014-04-19 MED ORDER — 0.9 % SODIUM CHLORIDE (POUR BTL) OPTIME
TOPICAL | Status: DC | PRN
  Administered 2014-04-19: 1000 mL

## 2014-04-19 MED ORDER — ONDANSETRON HCL 4 MG/2ML IJ SOLN
INTRAMUSCULAR | Status: DC | PRN
  Administered 2014-04-19: 4 mg via INTRAVENOUS

## 2014-04-19 MED ORDER — CEFAZOLIN SODIUM-DEXTROSE 2-3 GM-% IV SOLR
2.0000 g | Freq: Four times a day (QID) | INTRAVENOUS | Status: DC
  Administered 2014-04-19: 2 g via INTRAVENOUS
  Filled 2014-04-19 (×3): qty 50

## 2014-04-19 MED ORDER — FENTANYL CITRATE 0.05 MG/ML IJ SOLN
INTRAMUSCULAR | Status: AC
  Filled 2014-04-19: qty 5

## 2014-04-19 MED ORDER — ROCURONIUM BROMIDE 50 MG/5ML IV SOLN
INTRAVENOUS | Status: AC
  Filled 2014-04-19: qty 2

## 2014-04-19 MED ORDER — CEFAZOLIN SODIUM-DEXTROSE 2-3 GM-% IV SOLR
INTRAVENOUS | Status: DC | PRN
  Administered 2014-04-19: 2 g via INTRAVENOUS

## 2014-04-19 MED ORDER — ACETAMINOPHEN 325 MG PO TABS
650.0000 mg | ORAL_TABLET | Freq: Four times a day (QID) | ORAL | Status: DC | PRN
  Administered 2014-04-19 – 2014-04-22 (×5): 650 mg via ORAL
  Filled 2014-04-19 (×5): qty 2

## 2014-04-19 MED ORDER — DEXTROSE 5 % IV SOLN
10.0000 mg | INTRAVENOUS | Status: DC | PRN
  Administered 2014-04-19: 10 ug/min via INTRAVENOUS

## 2014-04-19 MED ORDER — DOCUSATE SODIUM 100 MG PO CAPS
100.0000 mg | ORAL_CAPSULE | Freq: Two times a day (BID) | ORAL | Status: DC
  Administered 2014-04-19 – 2014-04-22 (×7): 100 mg via ORAL
  Filled 2014-04-19 (×7): qty 1

## 2014-04-19 MED ORDER — LIDOCAINE HCL (CARDIAC) 20 MG/ML IV SOLN
INTRAVENOUS | Status: DC | PRN
  Administered 2014-04-19: 60 mg via INTRAVENOUS

## 2014-04-19 MED ORDER — PROPOFOL 10 MG/ML IV BOLUS
INTRAVENOUS | Status: DC | PRN
  Administered 2014-04-19: 100 mg via INTRAVENOUS

## 2014-04-19 MED ORDER — PHENYLEPHRINE HCL 10 MG/ML IJ SOLN
INTRAMUSCULAR | Status: DC | PRN
  Administered 2014-04-19 (×2): 80 ug via INTRAVENOUS

## 2014-04-19 MED ORDER — ARTIFICIAL TEARS OP OINT
TOPICAL_OINTMENT | OPHTHALMIC | Status: DC | PRN
  Administered 2014-04-19: 1 via OPHTHALMIC

## 2014-04-19 MED ORDER — METOCLOPRAMIDE HCL 5 MG/ML IJ SOLN: 5.0000 mg | Freq: Three times a day (TID) | INTRAMUSCULAR | Status: DC | PRN

## 2014-04-19 MED ORDER — ONDANSETRON HCL 4 MG/2ML IJ SOLN
INTRAMUSCULAR | Status: AC
  Filled 2014-04-19: qty 2

## 2014-04-19 MED ORDER — FENTANYL CITRATE 0.05 MG/ML IJ SOLN
INTRAMUSCULAR | Status: AC
  Filled 2014-04-19: qty 2

## 2014-04-19 MED ORDER — ONDANSETRON HCL 4 MG/2ML IJ SOLN: 4.0000 mg | Freq: Four times a day (QID) | INTRAMUSCULAR | Status: DC | PRN

## 2014-04-19 MED ORDER — RIVAROXABAN 15 MG PO TABS
15.0000 mg | ORAL_TABLET | Freq: Two times a day (BID) | ORAL | Status: AC
  Administered 2014-04-19 – 2014-04-20 (×3): 15 mg via ORAL
  Filled 2014-04-19 (×4): qty 1

## 2014-04-19 MED ORDER — HYDROCODONE-ACETAMINOPHEN 5-325 MG PO TABS
1.0000 | ORAL_TABLET | Freq: Four times a day (QID) | ORAL | Status: DC | PRN
  Administered 2014-04-20 – 2014-04-22 (×6): 1 via ORAL
  Filled 2014-04-19 (×6): qty 1

## 2014-04-19 MED ORDER — METOCLOPRAMIDE HCL 10 MG PO TABS: 5.0000 mg | ORAL_TABLET | Freq: Three times a day (TID) | ORAL | Status: DC | PRN

## 2014-04-19 MED ORDER — ACETAMINOPHEN 650 MG RE SUPP: 650.0000 mg | Freq: Four times a day (QID) | RECTAL | Status: DC | PRN

## 2014-04-19 MED ORDER — FENTANYL CITRATE 0.05 MG/ML IJ SOLN
INTRAMUSCULAR | Status: AC
Start: 2014-04-19 — End: 2014-04-19
  Filled 2014-04-19: qty 5

## 2014-04-19 NOTE — Evaluation (Signed)
Clinical/Bedside Swallow Evaluation Patient Details  Name: Crystal Robinson MRN: [id number] Date of Birth: [birth year]  Today's Date: 04/19/2014 Time: SLP Start Time (ACUTE ONLY): 1332 SLP Stop Time (ACUTE ONLY): 1342 SLP Time Calculation (min) (ACUTE ONLY): 10 min  Past Medical History:  Past Medical History  Diagnosis Date  . Mental retardation   . Diabetes mellitus without complication   . Hypertension   . Hypercholesterolemia   . DM (diabetes mellitus), type 2 03/28/2014  . Hyperlipidemia 03/28/2014  . DVT (deep venous thrombosis)    Past Surgical History:  Past Surgical History  Procedure Laterality Date  . Abdominal hysterectomy    . Fracture surgery    . Femur im nail Right 04/18/2014    Procedure: INTRAMEDULLARY  RETROGRADE FEMORAL NAILING;  Surgeon: Budd Palmer, MD;  Location: [facility] OR;  Service: Orthopedics;  Laterality: Right;   HPI:  Pt is a 74 y.o. female, with a past medical history of developmental disability, diabetes, hypertension as well as a recent diagnosis of extensive right lower extremity DVT on Xarelto who was sent to the ER from her home after experiencing a fall from her wheelchair during  a wheelchair transfer. The patient initially collapsed to her knees seemed to be okay but immediately after going to her knee she leaned backwards while turning her head; family nearby heard a pop that emanated from the right leg area. Evaluation in the ER revealed a normotensive patient who was not hypoxic. Right femur x-ray revealed a severely comminuted and impacted/angulated distal right femoral shaft fracture extending to the metadiaphysis. Portable chest x-ray revealed mild bibasilar atelectasis without any acute findings.   Assessment / Plan / Recommendation Clinical Impression   Goal of session was to determine pt safety of current thin liquid diet and possible upgrade. Pt was lethargic and intermittently fell asleep during session. PO trials with thin liquids and purees  did not elicit s/s of aspiration. PO trials with solid textures were not attempted; pt not alert. Pt did not communicate during session despite max cues from SLP and caregiver. Per caregiver report, pt did not exhibit s/s of aspiration during last meal and has never had any difficulty eating or swallowing. Recommend pt continue on current thin liquid diet until pt is more alert and responsive. Speech will follow-up for possible upgrade.      Aspiration Risk       Diet Recommendation Thin liquid   Liquid Administration via: Cup;Straw Medication Administration: Whole meds with puree Supervision: Staff to assist with self feeding Postural Changes and/or Swallow Maneuvers: Seated upright 90 degrees    Other  Recommendations Oral Care Recommendations: Oral care BID   Follow Up Recommendations       Frequency and Duration min 2x/week  2 weeks   Pertinent Vitals/Pain     SLP Swallow Goals     Swallow Study Prior Functional Status       General HPI: Pt is a 74 y.o. female, with a past medical history of developmental disability, diabetes, hypertension as well as a recent diagnosis of extensive right lower extremity DVT on Xarelto who was sent to the ER from her home after experiencing a fall from her wheelchair during  a wheelchair transfer. The patient initially collapsed to her knees seemed to be okay but immediately after going to her knee she leaned backwards while turning her head; family nearby heard a pop that emanated from the right leg area. Evaluation in the ER revealed a normotensive patient who was not  hypoxic. Right femur x-ray revealed a severely comminuted and impacted/angulated distal right femoral shaft fracture extending to the metadiaphysis. Portable chest x-ray revealed mild bibasilar atelectasis without any acute findings. Type of Study: Bedside swallow evaluation Diet Prior to this Study: Regular;Thin liquids Temperature Spikes Noted: No Respiratory Status: Room  air Behavior/Cognition: Lethargic Oral Cavity - Dentition: Missing dentition Self-Feeding Abilities: Needs assist Patient Positioning: Upright in bed    Oral/Motor/Sensory Function Overall Oral Motor/Sensory Function: Appears within functional limits for tasks assessed Labial ROM: Within Functional Limits Labial Symmetry: Within Functional Limits Lingual ROM: Within Functional Limits Lingual Symmetry: Within Functional Limits   Ice Chips Ice chips: Not tested   Thin Liquid Thin Liquid: Within functional limits    Nectar Thick Nectar Thick Liquid: Not tested   Honey Thick Honey Thick Liquid: Not tested   Puree Puree: Within functional limits   Solid   GO    Solid: Not tested       Bermudez-Bosch, Navaya Wiatrek 04/19/2014,1:51 PM

## 2014-04-19 NOTE — Transfer of Care (Signed)
Immediate Anesthesia Transfer of Care Note  Patient: Crystal Robinson  Procedure(s) Performed: Procedure(s): INTRAMEDULLARY  RETROGRADE FEMORAL NAILING (Right)  Patient Location: PACU  Anesthesia Type:General  Level of Consciousness: awake  Airway & Oxygen Therapy: Patient Spontanous Breathing and Patient connected to face mask oxygen  Post-op Assessment: Report given to RN and Post -op Vital signs reviewed and stable  Post vital signs: Reviewed and stable  Last Vitals:  Filed Vitals:   04/18/14 2007  BP: 146/55  Pulse: 103  Temp: 37.2 C  Resp: 16    Complications: No apparent anesthesia complications

## 2014-04-19 NOTE — Consult Note (Signed)
[facility]uma [facility] Consultation  Reason for Consult: Right femoral shaft fracture with tenting skin Referring Physician: Christ Kick, MD  Crystal Robinson is an 74 y.o. female.  HPI: [place] in transfer from wheelchair with audible pop in right thigh. History of developmental disability, diabetes, hypertension as well as a recent extensive right lower extremity DVT (thrombosis involving the right common femoral vein, right profunda femoris vein, right femoral vein, right popliteal vein, right posterial tibial vein, and right peroneal vein) on Xarelto.  IVC filter placed by Dr. Fredrich Birks. Has been nonambulatory for 6-7 years; lives with brother and sister-in-law.  Past Medical History  Diagnosis Date  . Mental retardation   . Diabetes mellitus without complication   . Hypertension   . Hypercholesterolemia   . DM (diabetes mellitus), type 2 03/28/2014  . Hyperlipidemia 03/28/2014  . DVT (deep venous thrombosis)     Past Surgical History  Procedure Laterality Date  . Abdominal hysterectomy    . Fracture surgery      History reviewed. No pertinent family history.  Social History:  reports that she has never smoked. She has never used smokeless tobacco. She reports that she does not drink alcohol or use illicit drugs.  Allergies: No Known Allergies  Medications: I have reviewed the patient's current medications.  Results for orders placed or performed during the hospital encounter of 04/18/14 (from the past 48 hour(s))  Comprehensive metabolic panel     Status: Abnormal   Collection Time: 04/18/14 11:35 AM  Result Value Ref Range   Sodium 144 135 - 145 mmol/L   Potassium 2.9 (L) 3.5 - 5.1 mmol/L   Chloride 105 96 - 112 mmol/L   CO2 31 19 - 32 mmol/L   Glucose, Bld 164 (H) 70 - 99 mg/dL   BUN 10 6 - 23 mg/dL   Creatinine, Ser 0.87 0.50 - 1.10 mg/dL   Calcium 10.1 8.4 - 10.5 mg/dL   Total Protein 6.1 6.0 - 8.3 g/dL   Albumin 3.4 (L) 3.5 - 5.2 g/dL   AST 31 0 - 37 U/L   ALT 22 0 - 35 U/L   Alkaline Phosphatase 57 39 - 117 U/L   Total Bilirubin 1.0 0.3 - 1.2 mg/dL   GFR calc non Af Amer 65 (L) >90 mL/min   GFR calc Af Amer 76 (L) >90 mL/min    Comment: (NOTE) The eGFR has been calculated using the CKD EPI equation. This calculation has not been validated in all clinical situations. eGFR's persistently <90 mL/min signify possible Chronic Kidney Disease.    Anion gap 8 5 - 15  CBC with Differential     Status: Abnormal   Collection Time: 04/18/14 11:35 AM  Result Value Ref Range   WBC 11.5 (H) 4.0 - 10.5 K/uL   RBC 4.70 3.87 - 5.11 MIL/uL   Hemoglobin 14.2 12.0 - 15.0 g/dL   HCT 41.7 36.0 - 46.0 %   MCV 88.7 78.0 - 100.0 fL   MCH 30.2 26.0 - 34.0 pg   MCHC 34.1 30.0 - 36.0 g/dL   RDW 13.1 11.5 - 15.5 %   Platelets 313 150 - 400 K/uL   Neutrophils Relative % 82 (H) 43 - 77 %   Neutro Abs 9.5 (H) 1.7 - 7.7 K/uL   Lymphocytes Relative 11 (L) 12 - 46 %   Lymphs Abs 1.3 0.7 - 4.0 K/uL   Monocytes Relative 4 3 - 12 %   Monocytes Absolute 0.5 0.1 - 1.0 K/uL  Eosinophils Relative 2 0 - 5 %   Eosinophils Absolute 0.2 0.0 - 0.7 K/uL   Basophils Relative 1 0 - 1 %   Basophils Absolute 0.1 0.0 - 0.1 K/uL  Sample to Blood Bank     Status: None   Collection Time: 04/18/14 11:43 AM  Result Value Ref Range   Blood Bank Specimen SAMPLE AVAILABLE FOR TESTING    Sample Expiration 04/19/2014   Type and screen     Status: None (Preliminary result)   Collection Time: 04/18/14 11:43 AM  Result Value Ref Range   ABO/RH(D) O POS    Antibody Screen NEG    Sample Expiration 04/21/2014    Unit Number [id number]    Blood Component Type RED CELLS,LR    Unit division 00    Status of Unit ISSUED    Transfusion Status OK TO TRANSFUSE    Crossmatch Result Compatible    Unit Number [id number]    Blood Component Type RED CELLS,LR    Unit division 00    Status of Unit ISSUED    Transfusion Status OK TO TRANSFUSE    Crossmatch Result Compatible    Unit Number  [id number]    Blood Component Type RBC LR PHER2    Unit division 00    Status of Unit ALLOCATED    Transfusion Status OK TO TRANSFUSE    Crossmatch Result Compatible    Unit Number [id number]    Blood Component Type RED CELLS,LR    Unit division 00    Status of Unit ALLOCATED    Transfusion Status OK TO TRANSFUSE    Crossmatch Result Compatible   ABO/Rh     Status: None   Collection Time: 04/18/14 11:43 AM  Result Value Ref Range   ABO/RH(D) O POS   Protime-INR     Status: Abnormal   Collection Time: 04/18/14  1:40 PM  Result Value Ref Range   Prothrombin Time 17.2 (H) 11.6 - 15.2 seconds   INR 1.39 0.00 - 1.49  APTT     Status: None   Collection Time: 04/18/14  1:40 PM  Result Value Ref Range   aPTT 32 24 - 37 seconds  Glucose, capillary     Status: Abnormal   Collection Time: 04/18/14  2:53 PM  Result Value Ref Range   Glucose-Capillary 138 (H) 70 - 99 mg/dL  Surgical pcr screen     Status: None   Collection Time: 04/18/14  2:56 PM  Result Value Ref Range   MRSA, PCR NEGATIVE NEGATIVE   Staphylococcus aureus NEGATIVE NEGATIVE    Comment:        The Xpert SA Assay (FDA approved for NASAL specimens in patients over 69 years of age), is one component of a comprehensive surveillance program.  Test performance has been validated by [facility] for patients greater than or equal to 65 year old. It is not intended to diagnose infection nor to guide or monitor treatment.   Urinalysis, Routine w reflex microscopic     Status: Abnormal   Collection Time: 04/18/14  3:27 PM  Result Value Ref Range   Color, Urine AMBER (A) YELLOW    Comment: BIOCHEMICALS MAY BE AFFECTED BY COLOR   APPearance TURBID (A) CLEAR   Specific Gravity, Urine 1.025 1.005 - 1.030   pH 5.0 5.0 - 8.0   Glucose, UA NEGATIVE NEGATIVE mg/dL   Hgb urine dipstick SMALL (A) NEGATIVE   Bilirubin Urine NEGATIVE NEGATIVE   Ketones, ur 15 (A) NEGATIVE  mg/dL   Protein, ur NEGATIVE NEGATIVE mg/dL    Urobilinogen, UA 0.2 0.0 - 1.0 mg/dL   Nitrite POSITIVE (A) NEGATIVE   Leukocytes, UA LARGE (A) NEGATIVE  Urine microscopic-add on     Status: Abnormal   Collection Time: 04/18/14  3:27 PM  Result Value Ref Range   Squamous Epithelial / LPF FEW (A) RARE   WBC, UA TOO NUMEROUS TO COUNT <3 WBC/hpf   RBC / HPF 3-6 <3 RBC/hpf   Bacteria, UA MANY (A) RARE   Casts HYALINE CASTS (A) NEGATIVE   Urine-Other MUCOUS PRESENT   Glucose, capillary     Status: Abnormal   Collection Time: 04/18/14  4:26 PM  Result Value Ref Range   Glucose-Capillary 135 (H) 70 - 99 mg/dL  Glucose, capillary     Status: Abnormal   Collection Time: 04/18/14  8:40 PM  Result Value Ref Range   Glucose-Capillary 168 (H) 70 - 99 mg/dL    Dg Chest 1 View  04/18/2014   CLINICAL DATA:  Fall today from wheelchair. Leg injury. Mentally handicapped. Initial encounter.  EXAM: CHEST - 1 VIEW  COMPARISON:  None.  FINDINGS: 1202 hr. There is mild patient rotation to the right. Allowing for this, the heart size and mediastinal contours are normal There is mild atelectasis at both lung bases, but no confluent airspace opacity, pneumothorax or significant pleural effusion. No acute fractures identified. Telemetry leads overlie the chest.  IMPRESSION: Mild bibasilar atelectasis.  No definite acute findings.   Electronically Signed   By: Camie Patience M.D.   On: 04/18/2014 12:39   Dg Abd 1 View  04/18/2014   CLINICAL DATA:  74 year old female status post IVC filter placement. Evaluate filter strut position.  EXAM: ABDOMEN - 1 VIEW  COMPARISON:  Intraoperative placement images obtained earlier today  FINDINGS: Bard Denali IVC filter projects over the right aspect of the spine in the expected location. The retrieval hook lines up with the inferior aspect of the L2 spinous process. Two of the Central struts appear detached suggesting partial crossing of the legs. There is no evidence of movement compared to the intraoperative placement images.  The  bowel gas pattern is unremarkable. No acute osseous abnormality. Multilevel degenerative disc disease and facet arthropathy. The bones appear osteopenic.  IMPRESSION: Well-positioned IVC filter as described above. There may be partial crossing of 2 of the midline struts.   Electronically Signed   By: Jacqulynn Cadet M.D.   On: 04/18/2014 20:00   Ir Ivc Filter Plmt / S&i /img Guid/mod Sed  04/18/2014   INDICATION: 74 year old with a right lower extremity DVT and right femur fracture. The patient will need to stop anticoagulation due to the fracture and needs pulmonary embolism prophylaxis.  EXAM: IVC FILTER PLACEMENT; IVC VENOGRAM; ULTRASOUND FOR VASCULAR ACCESS  Physician: Stephan Minister. Henn, MD  FLUOROSCOPY TIME:  5 min and 54 seconds  MEDICATIONS: 2 mg versed, 75 mcg fentanyl.  Insert moderate set  ANESTHESIA/SEDATION: Moderate sedation time: 45 min minutes  CONTRAST:  45 mL Omnipaque 300  PROCEDURE: Informed consent was obtained for an IVC venogram and filter placement by the family. Ultrasound demonstrated a patent right internal jugular vein. Ultrasound images were obtained for documentation. The right side of the neck was prepped and draped in a sterile fashion. Maximal barrier sterile technique was utilized including caps, mask, sterile gowns, sterile gloves, sterile drape, hand hygiene and skin antiseptic. The skin was anesthetized with 1% lidocaine. A 21 gauge needle was directed  into the vein with ultrasound guidance and a micropuncture dilator set was placed. A wire was advanced into the IVC. The filter sheath was advanced over the wire into the IVC. An IVC venogram was performed. Fluoroscopic images were obtained for documentation. A Bard Denali filter was deployed below the lowest renal vein. J wire was advanced beyond the filter in order to confirm stability of the filter. A follow-up venogram was performed and the vascular sheath was removed with manual compression.  FINDINGS: IVC was patent. Bilateral  renal veins were identified. The filter was deployed below the lowest renal vein. IVC filter is appropriately positioned below the renal veins. However, some of the filter legs are in close proximity and difficult to know if the legs are crossed or stuck to each other. A J wire was advanced through the filter and the leg positions did not change.  COMPLICATIONS: None  IMPRESSION: Successful placement of a retrievable IVC filter.  There is concern that some of the filter legs may be crossed but this is difficult to evaluate. Will plan for follow-up abdominal radiographs to better evaluate the filter placement and see if the leg positions change.   Electronically Signed   By: Markus Daft M.D.   On: 04/18/2014 19:12   Dg Femur, Min 2 Views Right  04/18/2014   CLINICAL DATA:  74 year old female who fell while transferring from wheelchair. Initial encounter.  EXAM: DG FEMUR 2+V*R*  COMPARISON:  None.  FINDINGS: Comminuted, impacted, and angulated fracture of the distal right femoral shaft. Overriding of fragments up to 6 cm. Posterior displacement and angulation of the distal fragment. The fracture plane extends to the distal right femur metadiaphysis.  Proximal right femur appears to remain intact. Right knee not entirely included.  IMPRESSION: Severely comminuted, impacted, and angulated distal right femoral shaft fracture extending to the metadiaphysis.   Electronically Signed   By: Lars Pinks M.D.   On: 04/18/2014 12:40    ROS as above; takes Vit D Blood pressure 146/55, pulse 103, temperature 99 F (37.2 C), temperature source Oral, resp. rate 16, height _0  (1.676 m), weight 178 lb (80.74 kg), SpO2 99 %. Physical Exam Pleasant and alert, [place]/AT No wheezing RRR Abd soft Pelvis--no traumatic wounds or rash, no ecchymosis, stable to manual stress, nontender LLE No traumatic wounds but clearly tenting anterior thigh skin, ecchymosis, no rash  Tender, swollen  Effusion  Unable to participate with  examination  Foot warm with appropriate CR    Assessment/Plan: R femoral shaft in nonambulatory patient with skin tenting for retrograde IM nailing. Risks have been discussed with family.  [facility], MD [facility] [phone] (p)   04/19/2014  12:22 AM

## 2014-04-19 NOTE — Anesthesia Procedure Notes (Signed)
Procedure Name: Intubation Date/Time: 04/19/2014 1:19 AM Performed by: Julianne RiceBILOTTA, Estefan Pattison Z Pre-anesthesia Checklist: Patient identified, Patient being monitored, Timeout performed, Emergency Drugs available and Suction available Patient Re-evaluated:Patient Re-evaluated prior to inductionOxygen Delivery Method: Circle system utilized Preoxygenation: Pre-oxygenation with 100% oxygen Intubation Type: IV induction Ventilation: Mask ventilation without difficulty Laryngoscope Size: Mac and 3 Grade View: Grade I Tube type: Oral Tube size: 7.5 mm Number of attempts: 1 Airway Equipment and Method: Stylet Placement Confirmation: ETT inserted through vocal cords under direct vision,  breath sounds checked- equal and bilateral and positive ETCO2 Secured at: 22 cm Tube secured with: Tape Dental Injury: Teeth and Oropharynx as per pre-operative assessment

## 2014-04-19 NOTE — Anesthesia Postprocedure Evaluation (Signed)
  Anesthesia Post-op Note  Patient: Crystal Robinson  Procedure(s) Performed: Procedure(s): INTRAMEDULLARY  RETROGRADE FEMORAL NAILING (Right)  Patient Location: PACU  Anesthesia Type:General  Level of Consciousness: awake and alert   Airway and Oxygen Therapy: Patient Spontanous Breathing  Post-op Pain: mild  Post-op Assessment: Post-op Vital signs reviewed, Patient's Cardiovascular Status Stable and Respiratory Function Stable  Post-op Vital Signs: Reviewed  Filed Vitals:   04/19/14 0400  BP:   Pulse: 94  Temp: 36.9 C  Resp: 13    Complications: No apparent anesthesia complications

## 2014-04-19 NOTE — Op Note (Signed)
NAMEugene Garnet:  Goodie, Megumi               ACCOUNT NO.:  [id number]  MEDICAL RECORD NO.:  [id number]  LOCATION:  [id number]                        FACILITY:  [facility]  PHYSICIAN:  Doralee AlbinoMichael H. Carola FrostHandy, M.D. DATE OF BIRTH:  [birth year]  DATE OF PROCEDURE:  04/19/2014 DATE OF DISCHARGE:                              OPERATIVE REPORT   PREOPERATIVE DIAGNOSES: 1. Right femoral shaft fracture, supracondylar extension. 2. Tethered right quadriceps tendon.  POSTOPERATIVE DIAGNOSES: 1. Right femoral shaft fracture, supracondylar extension. 2. Tethered right quadriceps tendon.  PROCEDURES: 1. Retrograde intramedullary nailing of the right femur, using a     [employer]omet [place] 12 x 360 mm statically locked nail. 2. Tenolysis, right quadriceps.  SURGEON:  Doralee AlbinoMichael H. Carola FrostHandy, M.D.  ASSISTANT:  Mearl LatinKeith W Paul, PA-C.  ANESTHESIA:  General.  COMPLICATIONS:  None.  SPECIMENS:  None.  DISPOSITION:  To PACU.  CONDITION:  Stable.  BRIEF SUMMARY AND INDICATION FOR PROCEDURE:  Crystal Robinson is a 74 year old female, non ambulator with several medical problems including MR. The patient sustained a fall with a pop in the right thigh during transfer from chair.  Risks and benefits of the surgery were discussed. Particularly because of her current large DVT, involving the right leg, it is being treated with Xarelto.  She underwent preoperative placement of an IVC filter.  These risks were discussed with her brother, who is the power of attorney as well as her sister-in-law.  These included bleeding, heart attack, stroke, death, malunion, nonunion, loss of motion, and many others.  They did wish to proceed.  BRIEF SUMMARY OF PROCEDURE:  Ms. Ike BeneOdom was given preoperative antibiotics, taken to the operating room, where general anesthesia was induced.  The right lower extremity was prepped and draped in usual sterile fashion.  There was significant tenting of the skin with impalement of the quadriceps tendon anteriorly and  laterally.  This could not be reduced or closed even after induction of general anesthesia.  The leg was positioned on a radiolucent table and then the distal incision was made medial to the patellar tendon.  The guide pin was advanced to the center of the sulcus, just anterior to Blumensaat's on lateral and advanced in the center of the distal femur.  The fracture could not be reduced adequately and consequently the plan to do the reduction without an open reduction was abandoned.  A 2 cm anterior incision was made, just proximal to the tethering and then a Cobb was introduced through this area down to the quadriceps tendon.  While pulling traction, I was able to then with both digital finger manipulation as well as the Cobb eventually, release/ tenolyse the tethering of the quadriceps tendon by sweeping the fascia distally and over the spike of bone while performing a manual reduction.  Once this was performed, we did achieve an anatomic reduction with bumps on the radiolucent triangle and we were able to proceed with the intramedullary nailing.  Montez MoritaKeith Paul did assist with this, was necessary for the tenolysis of the quad tendon.  The guide pin was advanced into the proximal femur, was sequentially reamed up to 13.5, and a 12 mm nail was inserted to the appropriate depth.  Four  distal locks were placed to control this very comminuted fracture and a baseline osteopenia.  Two proximal locks and these were checked for trajectory and length of orthogonal views.  Wounds were irrigated thoroughly and closed in standard layered fashion using 0 Vicryl, 2-0 Vicryl, and 3-0 nylon.  Sterile gently compressive dressing was applied from the foot to thigh.  The patient was awakened from anesthesia and transported to PACU in stable condition.  Again, Montez Morita, PA-C did assist throughout both with reduction as well as some definitive fixation.  PROGNOSIS:  Ms. Luepke will continue be bed-to-chair  with unrestricted range of motion of both the knee and ankle.  Will resume DVT prophylaxis with Lovenox and then restart the Xarelto in several days for long-term treatment of her very severe DVT.  She is at risk for further fractures or loss of reduction because of her limited ability to comply and with restrictions and her baseline osteoporosis.     Doralee Albino. Carola Frost, M.D.     MHH/MEDQ  D:  04/19/2014  T:  04/19/2014  Job:  [id number]

## 2014-04-19 NOTE — Progress Notes (Signed)
Orthopaedic Trauma Service Progress Note  Subjective  Doing well Resting comfortably  Brother at bedside   Review of Systems  Unable to perform ROS: mental acuity     Objective   BP 147/70 mmHg  Pulse 106  Temp(Src) 98.8 F (37.1 C) (Axillary)  Resp 18  Ht 5\' 6"  (1.676 m)  Wt 80.74 kg (178 lb)  BMI 28.74 kg/m2  SpO2 97%  Intake/Output      02/03 0701 - 02/04 0700 02/04 0701 - 02/05 0700   P.O. 0    I.V. (mL/kg) 1900 (23.5)    Total Intake(mL/kg) 1900 (23.5)    Urine (mL/kg/hr) 775    Blood 150    Total Output 925     Net +975          Urine Occurrence 1 x      Labs  Results for Crystal Robinson, Oletha (MRN [id number]) as of 04/19/2014 11:03  Ref. Range 04/19/2014 05:01  Sodium Latest Range: 135-145 mmol/L 138  Potassium Latest Range: 3.5-5.1 mmol/L 4.4  Chloride Latest Range: 96-112 mmol/L 103  CO2 Latest Range: 19-32 mmol/L 24  BUN Latest Range: 6-23 mg/dL 9  Creatinine Latest Range: 0.50-1.10 mg/dL 4.090.83  Calcium Latest Range: 8.4-10.5 mg/dL 9.1  GFR calc non Af Amer Latest Range: >90 mL/min 69 (L)  GFR calc Af Amer Latest Range: >90 mL/min 80 (L)  Glucose Latest Range: 70-99 mg/dL 811197 (H)  Anion gap Latest Range: 5-15  11  Magnesium Latest Range: 1.5-2.5 mg/dL 1.7  WBC Latest Range: 4.0-10.5 K/uL 12.0 (H)  RBC Latest Range: 3.87-5.11 MIL/uL 3.74 (L)  Hemoglobin Latest Range: 12.0-15.0 g/dL 91.411.4 (L)  HCT Latest Range: 36.0-46.0 % 34.1 (L)  MCV Latest Range: 78.0-100.0 fL 91.2  MCH Latest Range: 26.0-34.0 pg 30.5  MCHC Latest Range: 30.0-36.0 g/dL 78.233.4  RDW Latest Range: 11.5-15.5 % 13.4  Platelets Latest Range: 150-400 K/uL 310    Exam  Gen: resting comfortably in bed, NAD Lungs: clear anterior fields  Cardiac: s1 and s2  Abd: + BS, NTND Ext:       Right Lower Extremity   Dressing c/d/i   Ext warm  Moves toes, but does not really follow commands   + DP pulse  Difficult to assess sensation   Assessment and Plan   POD/HD#: 1  74 y/o female s/p  ground level fall with R distal femur fracture   1. Ground level fall  2. R distal femur fracture s/p IMN   NWB x 6 weeks  Unrestricted knee ROM   PT/OT  Pt essentially bed to chair transfers only   3. R leg DVT  On lovenox now  Ok to resume xarelto or agent of choice from ortho standpoint   4. Medical issues  Primary service  5. Dispo  PT and OT evals  Family would really like to take pt home after hospital stay, I would agree as she would be in familiar surroundings and it would be hard for her to communicate with SNF staff     Mearl LatinKeith W. Leonce Bale, PA-C [facility] [phone] (O) 04/19/2014 11:02 AM

## 2014-04-19 NOTE — Progress Notes (Signed)
PROGRESS NOTE    Crystal Robinson UJW:[id number] DOB: 1940/07/28 DOA: 04/18/2014 PCP: Jackie Plum, MD  HPI/Brief narrative 74 year old female with history of severe developmental disability, mostly mute, mostly bedbound, DM 2, HTN, recent diagnosis with extensive right lower extremity DVT and started on Xarelto, presented with fall when she was being assisted from bed to potty chair and sustained a right femur fracture with associated pain. Patient status post IVC filter and right femur IM nail.   Assessment/Plan:  1. Right femoral shaft fracture, sustained post mechanical fall: Status post ORIF/IM nail on 04/19/14. As per orthopedics: NWB 6 weeks , unrestricted knee ROM, PT and OT evaluation, patient essentially bed to chair transfers only & okay to resume Xarelto. 2. Extensive right lower extremity DVT: Due to recently diagnosed extensive right lower extremity DVT, fractured femur on the same side, planned surgical manipulation on the same side, there was concerned regarding clot propagation and PE. Thereby retrievable IVC filter placed prior to surgery which can be removed in 4-6 weeks by IR. We'll resume Xarelto this evening. 3. Acute post hemorrhagic anemia: Secondary to operative blood loss. Follow CBC in a.m. and transfuse if hemoglobin less than 7 g per DL. 4. Severe developmental disability: As per brother, mental status at baseline and patient is fully dependent on ADLs. 5. Essential hypertension: Controlled. Continue HCTZ and ramipril. 6. Uncontrolled DM 2: Continue Glucotrol and SSI. 7. Hypokalemia: Replace 8. Hyperlipidemia: Continue TriCor 9. Presumed UTI: IV Rocephin pending urine culture results.   Code Status: Full Family Communication: Discussed with brother Rayne Cowdrey at bedside. Disposition Plan: Home when medically stable.   Consultants:  Interventional radiology  Orthopedics  Procedures:  IVC filter placement 04/18/14  ORIF/IM nail right femur fracture  04/19/14  Foley catheter: Consider DC 04/20/14  Antibiotics:  IV Rocephin   Subjective: Patient nonverbal. According to brother at bedside, patient is doing well. She slept postoperatively for a couple of hours. Has eaten some liquid diet this morning.  Objective: Filed Vitals:   04/19/14 0658 04/19/14 0800 04/19/14 1152 04/19/14 1219  BP: 147/70   124/57  Pulse: 106   101  Temp: 98.8 F (37.1 C)     TempSrc: Axillary     Resp: 16 18 18 18   Height:      Weight:      SpO2: 97%   97%    Intake/Output Summary (Last 24 hours) at 04/19/14 1342 Last data filed at 04/19/14 0658  Gross per 24 hour  Intake   1900 ml  Output    925 ml  Net    975 ml   Filed Weights   04/18/14 1330  Weight: 80.74 kg (178 lb)     Exam:  General exam: Pleasant elderly female lying comfortably supine in bed.  Respiratory system: Clear. No increased work of breathing. Cardiovascular system: S1 & S2 heard, RRR. No JVD, murmurs, gallops, clicks or pedal edema. Gastrointestinal system: Abdomen is nondistended, soft and nontender. Normal bowel sounds heard. Foley + Central nervous system: Alert but nonverbal. Does not follow instructions. No focal neurological deficits. Extremities: Symmetric 5 x 5 power. Right lower extremity surgical dressing site clean and dry.   Data Reviewed: Basic Metabolic Panel:  Recent Labs Lab 04/18/14 1135 04/19/14 0501  NA 144 138  K 2.9* 4.4  CL 105 103  CO2 31 24  GLUCOSE 164* 197*  BUN 10 9  CREATININE 0.87 0.83  CALCIUM 10.1 9.1  MG  --  1.7   Liver Function Tests:  Recent Labs Lab 04/18/14 1135  AST 31  ALT 22  ALKPHOS 57  BILITOT 1.0  PROT 6.1  ALBUMIN 3.4*   No results for input(s): LIPASE, AMYLASE in the last 168 hours. No results for input(s): AMMONIA in the last 168 hours. CBC:  Recent Labs Lab 04/18/14 1135 04/19/14 0501  WBC 11.5* 12.0*  NEUTROABS 9.5*  --   HGB 14.2 11.4*  HCT 41.7 34.1*  MCV 88.7 91.2  PLT 313 310    Cardiac Enzymes: No results for input(s): CKTOTAL, CKMB, CKMBINDEX, TROPONINI in the last 168 hours. BNP (last 3 results) No results for input(s): PROBNP in the last 8760 hours. CBG:  Recent Labs Lab 04/18/14 1626 04/18/14 2040 04/19/14 0332 04/19/14 0654 04/19/14 1212  GLUCAP 135* 168* 147* 187* 227*    Recent Results (from the past 240 hour(s))  Surgical pcr screen     Status: None   Collection Time: 04/18/14  2:56 PM  Result Value Ref Range Status   MRSA, PCR NEGATIVE NEGATIVE Final   Staphylococcus aureus NEGATIVE NEGATIVE Final    Comment:        The Xpert SA Assay (FDA approved for NASAL specimens in patients over 72 years of age), is one component of a comprehensive surveillance program.  Test performance has been validated by [facility] for patients greater than or equal to 677 year old. It is not intended to diagnose infection nor to guide or monitor treatment.            Studies: Dg Chest 1 View  04/18/2014   CLINICAL DATA:  Fall today from wheelchair. Leg injury. Mentally handicapped. Initial encounter.  EXAM: CHEST - 1 VIEW  COMPARISON:  None.  FINDINGS: 1202 hr. There is mild patient rotation to the right. Allowing for this, the heart size and mediastinal contours are normal There is mild atelectasis at both lung bases, but no confluent airspace opacity, pneumothorax or significant pleural effusion. No acute fractures identified. Telemetry leads overlie the chest.  IMPRESSION: Mild bibasilar atelectasis.  No definite acute findings.   Electronically Signed   By: Roxy HorsemanBill  Veazey M.D.   On: 04/18/2014 12:39   Dg Abd 1 View  04/18/2014   CLINICAL DATA:  [id number] year old female status post IVC filter placement. Evaluate filter strut position.  EXAM: ABDOMEN - 1 VIEW  COMPARISON:  Intraoperative placement images obtained earlier today  FINDINGS: Bard Denali IVC filter projects over the right aspect of the spine in the expected location. The retrieval hook lines up  with the inferior aspect of the L2 spinous process. Two of the Central struts appear detached suggesting partial crossing of the legs. There is no evidence of movement compared to the intraoperative placement images.  The bowel gas pattern is unremarkable. No acute osseous abnormality. Multilevel degenerative disc disease and facet arthropathy. The bones appear osteopenic.  IMPRESSION: Well-positioned IVC filter as described above. There may be partial crossing of 2 of the midline struts.   Electronically Signed   By: Malachy MoanHeath  McCullough M.D.   On: 04/18/2014 20:00   Ir Ivc Filter Plmt / S&i /img Guid/mod Sed  04/18/2014   INDICATION: [id number] year old with a right lower extremity DVT and right femur fracture. The patient will need to stop anticoagulation due to the fracture and needs pulmonary embolism prophylaxis.  EXAM: IVC FILTER PLACEMENT; IVC VENOGRAM; ULTRASOUND FOR VASCULAR ACCESS  Physician: Rachelle HoraAdam R. Henn, MD  FLUOROSCOPY TIME:  5 min and 54 seconds  MEDICATIONS: 2 mg versed, 75 mcg fentanyl.  Insert moderate set  ANESTHESIA/SEDATION: Moderate sedation time: 45 min minutes  CONTRAST:  45 mL Omnipaque 300  PROCEDURE: Informed consent was obtained for an IVC venogram and filter placement by the family. Ultrasound demonstrated a patent right internal jugular vein. Ultrasound images were obtained for documentation. The right side of the neck was prepped and draped in a sterile fashion. Maximal barrier sterile technique was utilized including caps, mask, sterile gowns, sterile gloves, sterile drape, hand hygiene and skin antiseptic. The skin was anesthetized with 1% lidocaine. A 21 gauge needle was directed into the vein with ultrasound guidance and a micropuncture dilator set was placed. A wire was advanced into the IVC. The filter sheath was advanced over the wire into the IVC. An IVC venogram was performed. Fluoroscopic images were obtained for documentation. A Bard Denali filter was deployed below the lowest renal  vein. J wire was advanced beyond the filter in order to confirm stability of the filter. A follow-up venogram was performed and the vascular sheath was removed with manual compression.  FINDINGS: IVC was patent. Bilateral renal veins were identified. The filter was deployed below the lowest renal vein. IVC filter is appropriately positioned below the renal veins. However, some of the filter legs are in close proximity and difficult to know if the legs are crossed or stuck to each other. A J wire was advanced through the filter and the leg positions did not change.  COMPLICATIONS: None  IMPRESSION: Successful placement of a retrievable IVC filter.  There is concern that some of the filter legs may be crossed but this is difficult to evaluate. Will plan for follow-up abdominal radiographs to better evaluate the filter placement and see if the leg positions change.   Electronically Signed   By: Richarda Overlie M.D.   On: 04/18/2014 19:12   Dg Abd Portable 1v  04/19/2014   CLINICAL DATA:  IVC filter  EXAM: PORTABLE ABDOMEN - 1 VIEW  COMPARISON:  Portable exam 0755 hr compared to 04/18/2004  FINDINGS: Patient is severely rotated to the LEFT.  An IVC filter is identified projecting further over the spine due to rotation.  Due to rotation and superimposition with the spine, unable to assess the configuration of the limbs of the IVC filter.  Visualized bowel gas pattern is unremarkable.  IMPRESSION: IVC filter is suboptimally visualized due to superimposition with the spine and significant patient rotation.   Electronically Signed   By: Ulyses Southward M.D.   On: 04/19/2014 08:47   Dg C-arm 61-120 Min  04/19/2014   CLINICAL DATA:  Right femur fracture.  IM nail.  Initial encounter.  EXAM: RIGHT FEMUR 2 VIEWS; DG C-ARM 61-120 MIN  COMPARISON:  Preoperative radiographs.  FINDINGS: Seven intraoperative fluoroscopic spot images of the right femur in frontal and lateral projections from the operating room during right femur IM nail  insertion demonstrate placement of intra medullary nail with proximal and distal locking screws. There is improved fracture alignment. Total fluoroscopy time 1 min 48 seconds.  IMPRESSION: Intraoperative fluoroscopy during right femur fracture IM nail.   Electronically Signed   By: Rubye Oaks M.D.   On: 04/19/2014 04:24   Dg Femur, Min 2 Views Right  04/19/2014   CLINICAL DATA:  Right femur fracture.  IM nail.  Initial encounter.  EXAM: RIGHT FEMUR 2 VIEWS; DG C-ARM 61-120 MIN  COMPARISON:  Preoperative radiographs.  FINDINGS: Seven intraoperative fluoroscopic spot images of the right femur in frontal and lateral projections from the operating room  during right femur IM nail insertion demonstrate placement of intra medullary nail with proximal and distal locking screws. There is improved fracture alignment. Total fluoroscopy time 1 min 48 seconds.  IMPRESSION: Intraoperative fluoroscopy during right femur fracture IM nail.   Electronically Signed   By: Rubye Oaks M.D.   On: 04/19/2014 04:24   Dg Femur, Min 2 Views Right  04/18/2014   CLINICAL DATA:  74 year old female who fell while transferring from wheelchair. Initial encounter.  EXAM: DG FEMUR 2+V*R*  COMPARISON:  None.  FINDINGS: Comminuted, impacted, and angulated fracture of the distal right femoral shaft. Overriding of fragments up to 6 cm. Posterior displacement and angulation of the distal fragment. The fracture plane extends to the distal right femur metadiaphysis.  Proximal right femur appears to remain intact. Right knee not entirely included.  IMPRESSION: Severely comminuted, impacted, and angulated distal right femoral shaft fracture extending to the metadiaphysis.   Electronically Signed   By: Augusto Gamble M.D.   On: 04/18/2014 12:40   Dg Femur Port, Min 2 Views Right  04/19/2014   CLINICAL DATA:  Postoperative right femur IM nail.  EXAM: RIGHT FEMUR PORTABLE 1 VIEW  COMPARISON:  Radiographs 1 day prior.  FINDINGS: Intra medullary rod  with distal and proximal locking screws traverse the comminuted distal femur shaft fracture. There is improved alignment compared to preoperative exam. Postsurgical changes seen in the soft tissue.  IMPRESSION: Intra medullary rod traversing comminuted distal femur fracture in improved alignment. No immediate postprocedural complication.   Electronically Signed   By: Rubye Oaks M.D.   On: 04/19/2014 04:22        Scheduled Meds: . cefTRIAXone (ROCEPHIN)  IV  1 g Intravenous Q24H  . cholecalciferol  2,000 Units Oral Daily  . docusate sodium  100 mg Oral BID  . enoxaparin (LOVENOX) injection  1 mg/kg Subcutaneous Q12H  . fenofibrate  54 mg Oral Daily  . fentaNYL      . glipiZIDE  5 mg Oral Daily  . hydrochlorothiazide  25 mg Oral Daily  . insulin aspart  0-5 Units Subcutaneous QHS  . insulin aspart  0-9 Units Subcutaneous TID WC  . pantoprazole  40 mg Oral Q0600  . ramipril  10 mg Oral Daily  . vitamin C  500 mg Oral Daily  . vitamin E  400 Units Oral Daily   Continuous Infusions: . sodium chloride 50 mL/hr at 04/19/14 1112    Principal Problem:   Right femoral fracture Active Problems:   Mental retardation   HYPERTENSION, BENIGN SYSTEMIC   Dvt femoral (deep venous thrombosis)   DM (diabetes mellitus), type 2   Hyperlipidemia   Femoral condyle fracture   Hypokalemia    Time spent: 30 minutes.    Marcellus Scott, MD, FACP, FHM. [facility] Pager [phone]  If 7PM-7AM, please contact night-coverage [email or web address] Password [email or web address] 04/19/2014, 1:42 PM    LOS: 1 day

## 2014-04-19 NOTE — Brief Op Note (Signed)
04/18/2014 - 04/19/2014  8:40 AM  PATIENT:  Crystal DeterPatricia Aki  74 y.o. female  PRE-OPERATIVE DIAGNOSIS:   1. Right femoral shaft fracture with supracondylar extension 2. Tethered right quadricep tendon  POST-OPERATIVE DIAGNOSIS: 1. Right femoral shaft fracture with supracondylar extension 2. Tethered right quadricep tendon  PROCEDURE:  Procedure(s): 1. INTRAMEDULLARY  RETROGRADE FEMORAL NAILING (Right), with [employer] [place] 12 x 360 statically locked 2. Tenolysis right quadricep  SURGEON:  Surgeon(s) and Role:    * Budd PalmerMichael H Jevonte Clanton, MD - Primary  PHYSICIAN ASSISTANT: Montez MoritaKeith Paul, PA-C  ANESTHESIA:   general  I/O:     SPECIMEN:  No Specimen  TOURNIQUET:  * No tourniquets in log *  DICTATION: .Other Dictation: Dictation Number [id number]

## 2014-04-20 DIAGNOSIS — S7291XD Unspecified fracture of right femur, subsequent encounter for closed fracture with routine healing: Secondary | ICD-10-CM

## 2014-04-20 DIAGNOSIS — I82419 Acute embolism and thrombosis of unspecified femoral vein: Secondary | ICD-10-CM

## 2014-04-20 DIAGNOSIS — I10 Essential (primary) hypertension: Secondary | ICD-10-CM

## 2014-04-20 DIAGNOSIS — E119 Type 2 diabetes mellitus without complications: Secondary | ICD-10-CM

## 2014-04-20 LAB — GLUCOSE, CAPILLARY
GLUCOSE-CAPILLARY: 224 mg/dL — AB (ref 70–99)
Glucose-Capillary: 135 mg/dL — ABNORMAL HIGH (ref 70–99)
Glucose-Capillary: 213 mg/dL — ABNORMAL HIGH (ref 70–99)
Glucose-Capillary: 211 mg/dL — ABNORMAL HIGH (ref 70–99)

## 2014-04-20 LAB — URINE CULTURE: SPECIAL REQUESTS: NORMAL

## 2014-04-20 LAB — BASIC METABOLIC PANEL
Anion gap: 8 (ref 5–15)
BUN: 5 mg/dL — ABNORMAL LOW (ref 6–23)
CALCIUM: 9.2 mg/dL (ref 8.4–10.5)
CHLORIDE: 105 mmol/L (ref 96–112)
CO2: 26 mmol/L (ref 19–32)
CREATININE: 0.58 mg/dL (ref 0.50–1.10)
GFR calc non Af Amer: >90 mL/min (ref >90)
GLUCOSE: 144 mg/dL — AB (ref 70–99)
POTASSIUM: 3.7 mmol/L (ref 3.5–5.1)
SODIUM: 139 mmol/L (ref 135–145)

## 2014-04-20 LAB — CBC
HCT: 30.1 % — ABNORMAL LOW (ref 36.0–46.0)
HEMOGLOBIN: 10.1 g/dL — AB (ref 12.0–15.0)
MCH: 30.2 pg (ref 26.0–34.0)
MCHC: 33.6 g/dL (ref 30.0–36.0)
MCV: 90.1 fL (ref 78.0–100.0)
PLATELETS: 243 10*3/uL (ref 150–400)
RBC: 3.34 MIL/uL — ABNORMAL LOW (ref 3.87–5.11)
RDW: 13.3 % (ref 11.5–15.5)
WBC: 8.8 10*3/uL (ref 4.0–10.5)

## 2014-04-20 LAB — VITAMIN D 25 HYDROXY (VIT D DEFICIENCY, FRACTURES): VIT D 25 HYDROXY: 35.8 ng/mL (ref 30.0–100.0)

## 2014-04-20 MED ORDER — ACETAMINOPHEN 325 MG PO TABS: 650.0000 mg | ORAL_TABLET | Freq: Four times a day (QID) | ORAL | Status: DC | PRN

## 2014-04-20 MED ORDER — GLUCERNA SHAKE PO LIQD
237.0000 mL | Freq: Three times a day (TID) | ORAL | Status: DC
  Administered 2014-04-20 – 2014-04-21 (×2): 237 mL via ORAL

## 2014-04-20 MED ORDER — COLLAGENASE 250 UNIT/GM EX OINT
TOPICAL_OINTMENT | Freq: Every day | CUTANEOUS | Status: DC
  Administered 2014-04-21 – 2014-04-22 (×2): via TOPICAL
  Filled 2014-04-20: qty 30

## 2014-04-20 MED ORDER — ENSURE COMPLETE PO LIQD
237.0000 mL | Freq: Two times a day (BID) | ORAL | Status: DC
  Administered 2014-04-20 – 2014-04-22 (×4): 237 mL via ORAL

## 2014-04-20 NOTE — Progress Notes (Signed)
Speech Language Pathology Treatment: Dysphagia  Patient Details Name: Crystal Robinson MRN: [id number] DOB: April 17, 1940 Today's Date: 04/20/2014 Time: [id number] SLP Time Calculation (min) (ACUTE ONLY): 18 min  Assessment / Plan / Recommendation Clinical Impression  Pt siblings and niece present during treatment. Pt awake (slightly drowsy), ready to accept thin and solid texture for potential upgrade. Family reports pt eats regular textures at home (endentulous). Mildly delayed mastication and transit with cracker with minimal anterior residue. Family present at all times and state they cut up food that may be too tough, therefore recommending regular texture and thin liquids. Oral care kits brought to room and educated family to check oral cavity during and at end of meals for potential pocketed food. Would continue pills whole in applesauce due to intermittent drowsiness. ST will sign off.   HPI HPI: Pt is a 74 y.o. female, with a past medical history of developmental disability, diabetes, hypertension as well as a recent diagnosis of extensive right lower extremity DVT on Xarelto who was sent to the ER from her home after experiencing a fall from her wheelchair during  a wheelchair transfer. The patient initially collapsed to her knees seemed to be okay but immediately after going to her knee she leaned backwards while turning her head; family nearby heard a pop that emanated from the right leg area. Evaluation in the ER revealed a normotensive patient who was not hypoxic. Right femur x-ray revealed a severely comminuted and impacted/angulated distal right femoral shaft fracture extending to the metadiaphysis. Portable chest x-ray revealed mild bibasilar atelectasis without any acute findings.   Pertinent Vitals Pain Assessment: Faces Faces Pain Scale: Hurts a little bit  SLP Plan  Discharge SLP treatment due to (comment)    Recommendations Diet recommendations: Regular;Thin liquid Liquids provided  via: Cup;Straw Medication Administration: Whole meds with puree Supervision: Staff to assist with self feeding Compensations: Small sips/bites;Slow rate;Check for pocketing Postural Changes and/or Swallow Maneuvers: Seated upright 90 degrees              Oral Care Recommendations: Oral care BID Follow up Recommendations: None Plan: Discharge SLP treatment due to (comment)    GO     Royce MacadamiaLitaker, Jhan Conery Willis 04/20/2014, 11:07 AM   Breck CoonsLisa Willis Lonell FaceLitaker M.Ed [employer] Pager [phone]

## 2014-04-20 NOTE — Consult Note (Addendum)
WOC wound consult note Reason for Consult: Consult requested for sacrum and buttocks.  Family members at bedside are very well-informed regarding topical treatment. Wounds to buttocks have been present prior to admission; she previously had slough which was decreasing in size according to family members and patchy areas of stage 2 wounds to this area.   Wound type: Pt had prolonged period of time in the perioperative area yesterday and wound has declined upon assessment.  Family members observed site when dressing was removed.  They state she has a skin reaction to Allevyn which occurred prior to admission and this product can not be used, this is also true with other adhesives. Pressure Ulcer POA: Yes Measurement: Affected area to buttocks and sacrum is 5X5cm.  Unstageable wound in middle over sacrum is approx 2X.3cm, 100% tightly adhered yellow slough.  Patchy areas of partial thickness skin loss surrounding; appearance is consistent with moisture associated skin damage. Wound beds are red and moist. Small amt yellow drainage.  Dark purple deep tissue injury surrounding sacrum/buttocks area to approx 3 cm. Dressing procedure/placement/frequency: One of the family members is a physical therapist and is assisting with organizing plan of care after discharge.  They are working towards having an air mattress at home and a gel cushion for her chair when she is out of bed.  Several consults have been requested to assist with planning discharge; physicial therapy, social work, and care management. Family members assessed wounds and discussed plan of care. Plan: Santyl ointment to chemically debride nonviable tissue to sacrum.  Gauze and paper tape to avoid sensitivity with adhesive products.  Limit time in the chair to 2 hours at a time to reduce pressure.  Air mattress replacement to reduce pressure to sacrum/buttocks.  Family verbalizes understanding with plan of care.   Agree that pt will need air-mattress, gel  cushion, and limited time OOB to 2 hours each period when at home to promote healing to wound area. Please re-consult if further assistance is needed.  Thank-you,  Cammie Mcgeeawn Bindu Docter MSN, RN, CWOCN, [place]-AP, CNS [phone]

## 2014-04-20 NOTE — Progress Notes (Signed)
Occupational Therapy Evaluation Patient Details Name: Crystal Robinson MRN: [id number] DOB: 1940-12-26 Today's Date: 04/20/2014    History of Present Illness 74 yo with severe developmental delay s/p fall with resulting right femur fracture; status post IVC filter and right femur IM nail.   Clinical Impression   Discussion with pt's brother regarding home needs to assist with managing pt. Rec HHOT and drop arm 3 in 1 BSC. All further OT deferred to [facility]. Brother very Adult nurse of help.    Follow Up Recommendations  Home health OT;Supervision/Assistance - 24 hour    Equipment Recommendations  3 in 1 bedside comode (wide drop arm commode)    Recommendations for Other Services       Precautions / Restrictions Precautions Precautions: Fall Restrictions RLE Weight Bearing: Non weight bearing      Mobility Bed Mobility Overal bed mobility: +2 for physical assistance;Needs Assistance                Transfers                 General transfer comment: will require use of lift equipment/maximove    Balance                                            ADL Overall ADL's : At baseline; total A with the exception of feeding self. Feel HHOT will assist in educating family to better manage pt at home in effort to  prevent future falls                                             Vision                                Pertinent Vitals/Pain Pain Assessment: Faces Faces Pain Scale: Hurts a little bit     Hand Dominance     Extremity/Trunk Assessment Upper Extremity Assessment Upper Extremity Assessment: Overall WFL for tasks assessed   Lower Extremity Assessment Lower Extremity Assessment: Defer to PT evaluation   Cervical / Trunk Assessment Cervical / Trunk Assessment: Normal   Communication Communication Communication: Expressive difficulties;Other (comment) (MR baseline)   Cognition Arousal/Alertness:  Awake/alert Behavior During Therapy:  (at baseline) Overall Cognitive Status: History of cognitive impairments - at baseline                     General Comments  Brother states equipment is being delivered to home                 Home Living Family/patient expects to be discharged to:: Private residence Living Arrangements: Other relatives Available Help at Discharge: Family;Available 24 hours/day Type of Home: House Home Access: Ramped entrance     Home Layout: Able to live on main level with bedroom/bathroom     Bathroom Shower/Tub: Walk-in shower;Other (comment) (handicapp accessible)     Bathroom Accessibility: Yes How Accessible: Accessible via wheelchair Home Equipment: Hospital bed;Other (comment);Bedside commode;Walker - 2 wheels;Wheelchair - manual (ordering air overlay mattress; lift chair)   Additional Comments: brother gave measurements to PT      Prior Functioning/Environment Level of Independence: Needs assistance  Gait / Transfers Assistance Needed: mod A with  transfers ADL's / Homemaking Assistance Needed: total A but could feed herself Communication / Swallowing Assistance Needed: mute      OT Diagnosis: Generalized weakness;Acute pain;Cognitive deficits (MR at baseline)   OT Problem List: Decreased range of motion;Decreased strength;Decreased activity tolerance;Decreased knowledge of use of DME or AE;Decreased knowledge of precautions;Obesity;Pain   OT Treatment/Interventions:      OT Goals(Current goals can be found in the care plan section) Acute Rehab OT Goals Patient Stated Goal: unable to state OT Goal Formulation: All assessment and education complete, DC therapy  OT Frequency:     Barriers to D/C:            Co-evaluation              End of Session Nurse Communication: Mobility status;Precautions;Weight bearing status  Activity Tolerance: Patient tolerated treatment well Patient left: in bed;with call bell/phone  within reach;with family/visitor present   Time: [id number] OT Time Calculation (min): 17 min Charges:  OT General Charges $OT Visit: 1 Procedure OT Evaluation $Initial OT Evaluation Tier I: 1 Procedure G-Codes:    Zera Markwardt,HILLARY 04/20/2014, 4:46 PM   Luisa DagoHilary Atina Feeley, OTR/L  [phone] 04/20/2014

## 2014-04-20 NOTE — Progress Notes (Signed)
04/20/14 Contacted Frank with [facility], mattress will be delivered 04/21/14 between 11-3pm. Requested wide 3N1 with drop arm, will be delivered to patient's room. Homero FellersFrank stated that he will get a roho cushion from the [facility] [employer] for the patient.  No other d/c needs identfied.       04/20/14 Spoke with patient's brother Ray,whom she lives with about [facility] and equipment needs on 04/19/14. He stated that he and his wife are the patient's caregivers and patient has nurse visits by [facility].He requested a hospital bed and a hoyer lift. Contacted Frank at [facility] and requested a hospital bed, alternating pressure pump pad mattress, and a hoyer lift be delivered to the patient's home.Bed and hoyer have been delivered but mattress was not. I contacted Homero FellersFrank at [facility] and he will look into the matter and call me back.PT also recommended roho cushion for her wheelchair. [facility] does not carry those at the hospital, called their store- awaiting call back. [facility]ed [facility] at [facility] and [facility]ub[facility] and added HHPT, HHOT and aide visits. Will continue to follow until discharge.

## 2014-04-20 NOTE — Progress Notes (Signed)
Orthopaedic Trauma Service (OTS)  Subjective: 2 Days Post-Op Procedure(s) (LRB): INTRAMEDULLARY  RETROGRADE FEMORAL NAILING (Right) Patient does not appear to be in pain.  Brother and daughter-in-law at bedside.  Objective: Current Vitals Blood pressure 136/56, pulse 106, temperature 99.8 F (37.7 C), temperature source Axillary, resp. rate 16, height 5\' 6"  (1.676 m), weight 178 lb (80.74 kg), SpO2 95 %. Vital signs in last 24 hours: Temp:  [99.8 F (37.7 C)-100.4 F (38 C)] 99.8 F (37.7 C) (02/05 0524) Pulse Rate:  [101-112] 106 (02/05 0524) Resp:  [14-18] 16 (02/05 0524) BP: (124-136)/(47-57) 136/56 mmHg (02/05 0524) SpO2:  [94 %-97 %] 95 % (02/05 0524)  Intake/Output from previous day: 02/04 0701 - 02/05 0700 In: 1410 [P.O.:1410] Out: 3275 [Urine:3275]  LABS  Recent Labs  04/18/14 1135 04/19/14 0501 04/20/14 0457  HGB 14.2 11.4* 10.1*    Recent Labs  04/19/14 0501 04/20/14 0457  WBC 12.0* 8.8  RBC 3.74* 3.34*  HCT 34.1* 30.1*  PLT 310 243    Recent Labs  04/19/14 0501 04/20/14 0457  NA 138 139  K 4.4 3.7  CL 103 105  CO2 24 26  BUN 9 5*  CREATININE 0.83 0.58  GLUCOSE 197* 144*  CALCIUM 9.1 9.2    Recent Labs  04/18/14 1340  INR 1.39    Physical Exam  RLE: drsg clean and dry; ACE in place with min edema; warm w unchanged CR  Assessment/Plan: 2 Days Post-Op Procedure(s) (LRB): INTRAMEDULLARY  RETROGRADE FEMORAL NAILING (Right) Pressure sore presacral area  1. Sacral area; mattress, cushion; wound care seeing; hospital bed; hoyer lift 2. NWB on RLE with unlimited ROM of the knee 3. Ted hose thigh high when ACEs come off 4. F/u in 2-3 weeks for suture removal  Myrene GalasMichael Emmons Toth, MD [facility] [phone] (p)   04/20/2014, 10:03 AM

## 2014-04-20 NOTE — Evaluation (Signed)
Physical Therapy Evaluation Patient Details Name: Crystal Robinson MRN: [id number] DOB: 1940-12-08 Today's Date: 04/20/2014   History of Present Illness  74 [facility] with severe developmental delay s/p fall with resulting right femur fracture; status post IVC filter and right femur IM nail.  Pt with other significant PMHx of DM, HTN, and DVT.  Clinical Impression  Pt was able to tolerate both sitting EOB and transferring OOB to the recliner chair using the maxi move total lift today.  She had some grimacing during ROM to right knee and hip, but otherwise seemed to have pain well controlled.  Family is refusing SNF for rehab and has been caring for her almost at a dependent level at home PTA.  They will need additional equipment now that she is unable to stand and pivot to get up out of the bed. They will need a hospital bed with alternating pressure air mattress to help with her sacral wound.  They will need a Roho cushion for pressure relief while up in the recliner chair, and they will need a hoyer lift to ensure a safe transfer OOB.  HHPT and a HH aide are also recommended for mobility training and right leg exercise progression as well as assisting family with her new level of dependence.   PT to follow acutely for deficits listed below.       Follow Up Recommendations Home health PT;Other (comment);Supervision/Assistance - 24 hour (HH aide)    Equipment Recommendations  Wheelchair cushion (measurements PT);Hospital bed;Other (comment) (20x18 Roho due to wound, hoyer lift, alt pres air mattress, )    Recommendations for Other Services   NA    Precautions / Restrictions Precautions Precautions: Fall Restrictions RLE Weight Bearing: Non weight bearing      Mobility  Bed Mobility Overal bed mobility: +2 for physical assistance;Needs Assistance Bed Mobility: Sidelying to Sit   Sidelying to sit: +2 for physical assistance;Max assist       General bed mobility  comments: Two person max assist to help progress bil legs over EOB and despite manual facilitation, we were unable to get her to grab to the bed rails to assist at lifting her trunk up to sitting.   Transfers                 General transfer comment: Pt was unable to maintain sitting balance EOB, so did not feel safe attempting stand pivot, especially with one leg neeing to be NWB (right).  Used maxi move total lift to get her up for benefits of being OOB. Chair padded with pillows and specific instructions to family and RN tech for pt not to be up longer than an hour due to pressure wound on her sacrum.           Balance Overall balance assessment: Needs assistance Sitting-balance support: Feet supported;Bilateral upper extremity supported Sitting balance-Leahy Scale: Poor Sitting balance - Comments: Pt required max assist when first coming to sitting EOB.  After sitting for 5 mins she relaxed her trunk and was able to be close supervision, however, after ~1.5 mins she started to fatigue and lose her balance to the left.                                      Pertinent Vitals/Pain Pain Assessment: Faces Faces Pain Scale: Hurts little more (only with ROM of right leg)  Home Living Family/patient expects to be discharged to:: Private residence Living Arrangements: Other relatives Available Help at Discharge: Family;Available 24 hours/day Type of Home: House Home Access: Ramped entrance     Home Layout: Able to live on main level with bedroom/bathroom Home Equipment: Hospital bed;Other (comment);Bedside commode;Walker - 2 wheels;Wheelchair - manual (ordering air overlay mattress; lift chair) Additional Comments: WC at home is 20 wide x 18 deep    Prior Function Level of Independence: Needs assistance   Gait / Transfers Assistance Needed: mod A with stand pivot transfers  ADL's / Homemaking Assistance Needed: total A but could feed herself            Extremity/Trunk Assessment   Upper Extremity Assessment: Defer to OT evaluation           Lower Extremity Assessment: RLE deficits/detail;LLE deficits/detail RLE Deficits / Details: right leg with normal post op pain and weakness.  Pt able to wiggle and seems to be able to feel her toes on the right.  Some trace muscle activation at the knee and 2=/5 strength in hip flexion observed during mobility assessment.  LLE Deficits / Details: generalized weakness.  In sitting EOB pt likes to ER at both hips and it is dificult to get good foot positioning on the ground.   Cervical / Trunk Assessment: Normal  Communication   Communication: Expressive difficulties;Other (comment) (mental handicap at baseline. )  Cognition Arousal/Alertness: Awake/alert Behavior During Therapy: Restless Overall Cognitive Status: History of cognitive impairments - at baseline                         Exercises Total Joint Exercises Ankle Circles/Pumps: PROM;Left;10 reps;Supine Heel Slides: PROM;Right;10 reps;Supine Hip ABduction/ADduction: PROM;Right;10 reps;Supine      Assessment/Plan    PT Assessment Patient needs continued PT services  PT Diagnosis Difficulty walking;Abnormality of gait;Generalized weakness;Acute pain   PT Problem List Decreased strength;Decreased activity tolerance;Decreased range of motion;Decreased balance;Decreased mobility;Pain  PT Treatment Interventions DME instruction;Functional mobility training;Therapeutic activities;Therapeutic exercise;Balance training;Neuromuscular re-education;Patient/family education;Manual techniques   PT Goals (Current goals can be found in the Care Plan section) Acute Rehab PT Goals Patient Stated Goal: unable to state, family wants her home PT Goal Formulation: With family Time For Goal Achievement: 04/27/14 Potential to Achieve Goals: Fair    Frequency Min 3X/week   Barriers to discharge   Pt is being cared for by her brother and his  wife. Brother's wife has had several spinal fusions.        End of Session   Activity Tolerance: Patient tolerated treatment well Patient left: in chair;with call bell/phone within reach;with family/visitor present;with chair alarm set Nurse Communication: Mobility status;Need for lift equipment         Time: 1610-9604 PT Time Calculation (min) (ACUTE ONLY): 46 min   Charges:   PT Evaluation $Initial PT Evaluation Tier I: 1 Procedure PT Treatments $Therapeutic Exercise: 8-22 mins $Therapeutic Activity: 8-22 mins        Latriece Anstine B. Kirat Mezquita, PT, DPT [phone]   04/20/2014, 6:39 PM

## 2014-04-20 NOTE — Progress Notes (Signed)
PROGRESS NOTE    Crystal Robinson ZOX:[id number] DOB: 1940-08-24 DOA: 04/18/2014 PCP: Jackie PlumSEI-BONSU,GEORGE, MD  HPI/Brief narrative 74 year old female with history of severe developmental disability, mostly mute, mostly bedbound, DM 2, HTN, recent diagnosis with extensive right lower extremity DVT and started on Xarelto, presented with fall when she was being assisted from bed to potty chair and sustained a right femur fracture with associated pain. Patient status post IVC filter and right femur IM nail.   Assessment/Plan:  Right femoral shaft fracture, sustained post mechanical fall:  Status post ORIF/IM nail on 04/19/14.  As per orthopedics: NWB 6 weeks , unrestricted knee ROM,. PT and OT evaluation, patient essentially bed to chair transfers only & okay to resume Xarelto. Patient baseline and able to ambulate on her own, family elected to take her back home. She will need PT/OT at home, discussed with family, brother and sister-in-law at bedside.  Extensive right lower extremity DVT:  Due to recently diagnosed extensive right lower extremity DVT, fractured femur on the same side. IVC filter placed on 04/18/14, to prevent any PE if any DVT accidentally dislodged during manuplation of the same leg. Xarelto resumed.  Acute post hemorrhagic anemia:  Secondary to operative blood loss. Hemoglobin is 10.1, no indication for transfusion.  Severe developmental disability: As per brother, mental status at baseline and patient is fully dependent on ADLs.  Essential hypertension: Controlled. Continue HCTZ and ramipril.  Uncontrolled DM 2: Continue Glucotrol and SSI.  Hypokalemia: Replaced with oral supplements, potassium 3.7 today.  Hyperlipidemia: Continue [employer]Cor  UTI: IV Rocephin pending urine culture results.   Code Status: Full Family Communication: Discussed with brother Kristeen MansRay Joshua at bedside. Disposition Plan: Home when medically stable.   Consultants:  Interventional  radiology  Orthopedics  Procedures:  IVC filter placement 04/18/14  ORIF/IM nail right femur fracture 04/19/14  Foley catheter: Consider DC 04/20/14  Antibiotics:  IV Rocephin   Subjective: Patient nonverbal. According to brother at bedside, patient is doing well. She slept postoperatively for a couple of hours. Has eaten some liquid diet this morning.  Objective: Filed Vitals:   04/20/14 0000 04/20/14 0106 04/20/14 0400 04/20/14 0524  BP:    136/56  Pulse:    106  Temp:  99.9 F (37.7 C)  99.8 F (37.7 C)  TempSrc:      Resp: 16  14 16   Height:      Weight:      SpO2: 94%  95% 95%    Intake/Output Summary (Last 24 hours) at 04/20/14 1153 Last data filed at 04/20/14 1053  Gross per 24 hour  Intake   1650 ml  Output   4675 ml  Net  -3025 ml   Filed Weights   04/18/14 1330  Weight: 80.74 kg (178 lb)     Exam:  General exam: Pleasant elderly female lying comfortably supine in bed.  Respiratory system: Clear. No increased work of breathing. Cardiovascular system: S1 & S2 heard, RRR. No JVD, murmurs, gallops, clicks or pedal edema. Gastrointestinal system: Abdomen is nondistended, soft and nontender. Normal bowel sounds heard. Foley + Central nervous system: Alert but nonverbal. Does not follow instructions. No focal neurological deficits. Extremities: Symmetric 5 x 5 power. Right lower extremity surgical dressing site clean and dry.   Data Reviewed: Basic Metabolic Panel:  Recent Labs Lab 04/18/14 1135 04/19/14 0501 04/20/14 0457  NA 144 138 139  K 2.9* 4.4 3.7  CL 105 103 105  CO2 31 24 26   GLUCOSE 164* 197* 144*  BUN 10 9 5*  CREATININE 0.87 0.83 0.58  CALCIUM 10.1 9.1 9.2  MG  --  1.7  --    Liver Function Tests:  Recent Labs Lab 04/18/14 1135  AST 31  ALT 22  ALKPHOS 57  BILITOT 1.0  PROT 6.1  ALBUMIN 3.4*   No results for input(s): LIPASE, AMYLASE in the last 168 hours. No results for input(s): AMMONIA in the last 168  hours. CBC:  Recent Labs Lab 04/18/14 1135 04/19/14 0501 04/20/14 0457  WBC 11.5* 12.0* 8.8  NEUTROABS 9.5*  --   --   HGB 14.2 11.4* 10.1*  HCT 41.7 34.1* 30.1*  MCV 88.7 91.2 90.1  PLT 313 310 243   Cardiac Enzymes: No results for input(s): CKTOTAL, CKMB, CKMBINDEX, TROPONINI in the last 168 hours. BNP (last 3 results) No results for input(s): PROBNP in the last 8760 hours. CBG:  Recent Labs Lab 04/19/14 0654 04/19/14 1212 04/19/14 1648 04/19/14 2130 04/20/14 0632  GLUCAP 187* 227* 238* 225* 135*    Recent Results (from the past 240 hour(s))  Surgical pcr screen     Status: None   Collection Time: 04/18/14  2:56 PM  Result Value Ref Range Status   MRSA, PCR NEGATIVE NEGATIVE Final   Staphylococcus aureus NEGATIVE NEGATIVE Final    Comment:        The Xpert SA Assay (FDA approved for NASAL specimens in patients over 32 years of age), is one component of a comprehensive surveillance program.  Test performance has been validated by [facility] for patients greater than or equal to 41 year old. It is not intended to diagnose infection nor to guide or monitor treatment.   Urine culture     Status: None (Preliminary result)   Collection Time: 04/18/14  3:27 PM  Result Value Ref Range Status   Specimen Description URINE, CATHETERIZED  Final   Special Requests Normal  Final   Colony Count   Final    >=100,000 COLONIES/ML Performed at [facility]    Culture   Final    ESCHERICHIA COLI Performed at [facility]    Report Status PENDING  Incomplete           Studies: Dg Chest 1 View  04/18/2014   CLINICAL DATA:  Fall today from wheelchair. Leg injury. Mentally handicapped. Initial encounter.  EXAM: CHEST - 1 VIEW  COMPARISON:  None.  FINDINGS: 1202 hr. There is mild patient rotation to the right. Allowing for this, the heart size and mediastinal contours are normal There is mild atelectasis at both lung bases, but no confluent airspace  opacity, pneumothorax or significant pleural effusion. No acute fractures identified. Telemetry leads overlie the chest.  IMPRESSION: Mild bibasilar atelectasis.  No definite acute findings.   Electronically Signed   By: Roxy Horseman M.D.   On: 04/18/2014 12:39   Dg Abd 1 View  04/18/2014   CLINICAL DATA:  74 year old female status post IVC filter placement. Evaluate filter strut position.  EXAM: ABDOMEN - 1 VIEW  COMPARISON:  Intraoperative placement images obtained earlier today  FINDINGS: Bard Denali IVC filter projects over the right aspect of the spine in the expected location. The retrieval hook lines up with the inferior aspect of the L2 spinous process. Two of the Central struts appear detached suggesting partial crossing of the legs. There is no evidence of movement compared to the intraoperative placement images.  The bowel gas pattern is unremarkable. No acute osseous abnormality. Multilevel degenerative  disc disease and facet arthropathy. The bones appear osteopenic.  IMPRESSION: Well-positioned IVC filter as described above. There may be partial crossing of 2 of the midline struts.   Electronically Signed   By: Malachy Moan M.D.   On: 04/18/2014 20:00   Ir Ivc Filter Plmt / S&i /img Guid/mod Sed  04/18/2014   INDICATION: 74 year old with a right lower extremity DVT and right femur fracture. The patient will need to stop anticoagulation due to the fracture and needs pulmonary embolism prophylaxis.  EXAM: IVC FILTER PLACEMENT; IVC VENOGRAM; ULTRASOUND FOR VASCULAR ACCESS  Physician: Rachelle Hora. Henn, MD  FLUOROSCOPY TIME:  5 min and 54 seconds  MEDICATIONS: 2 mg versed, 75 mcg fentanyl.  Insert moderate set  ANESTHESIA/SEDATION: Moderate sedation time: 45 min minutes  CONTRAST:  45 mL Omnipaque 300  PROCEDURE: Informed consent was obtained for an IVC venogram and filter placement by the family. Ultrasound demonstrated a patent right internal jugular vein. Ultrasound images were obtained for  documentation. The right side of the neck was prepped and draped in a sterile fashion. Maximal barrier sterile technique was utilized including caps, mask, sterile gowns, sterile gloves, sterile drape, hand hygiene and skin antiseptic. The skin was anesthetized with 1% lidocaine. A 21 gauge needle was directed into the vein with ultrasound guidance and a micropuncture dilator set was placed. A wire was advanced into the IVC. The filter sheath was advanced over the wire into the IVC. An IVC venogram was performed. Fluoroscopic images were obtained for documentation. A Bard Denali filter was deployed below the lowest renal vein. J wire was advanced beyond the filter in order to confirm stability of the filter. A follow-up venogram was performed and the vascular sheath was removed with manual compression.  FINDINGS: IVC was patent. Bilateral renal veins were identified. The filter was deployed below the lowest renal vein. IVC filter is appropriately positioned below the renal veins. However, some of the filter legs are in close proximity and difficult to know if the legs are crossed or stuck to each other. A J wire was advanced through the filter and the leg positions did not change.  COMPLICATIONS: None  IMPRESSION: Successful placement of a retrievable IVC filter.  There is concern that some of the filter legs may be crossed but this is difficult to evaluate. Will plan for follow-up abdominal radiographs to better evaluate the filter placement and see if the leg positions change.   Electronically Signed   By: Richarda Overlie M.D.   On: 04/18/2014 19:12   Dg Abd Portable 1v  04/19/2014   CLINICAL DATA:  IVC filter  EXAM: PORTABLE ABDOMEN - 1 VIEW  COMPARISON:  Portable exam 0755 hr compared to 04/18/2004  FINDINGS: Patient is severely rotated to the LEFT.  An IVC filter is identified projecting further over the spine due to rotation.  Due to rotation and superimposition with the spine, unable to assess the configuration  of the limbs of the IVC filter.  Visualized bowel gas pattern is unremarkable.  IMPRESSION: IVC filter is suboptimally visualized due to superimposition with the spine and significant patient rotation.   Electronically Signed   By: Ulyses Southward M.D.   On: 04/19/2014 08:47   Dg C-arm 61-120 Min  04/19/2014   CLINICAL DATA:  Right femur fracture.  IM nail.  Initial encounter.  EXAM: RIGHT FEMUR 2 VIEWS; DG C-ARM 61-120 MIN  COMPARISON:  Preoperative radiographs.  FINDINGS: Seven intraoperative fluoroscopic spot images of the right femur in frontal  and lateral projections from the operating room during right femur IM nail insertion demonstrate placement of intra medullary nail with proximal and distal locking screws. There is improved fracture alignment. Total fluoroscopy time 1 min 48 seconds.  IMPRESSION: Intraoperative fluoroscopy during right femur fracture IM nail.   Electronically Signed   By: Rubye Oaks M.D.   On: 04/19/2014 04:24   Dg Femur, Min 2 Views Right  04/19/2014   CLINICAL DATA:  Right femur fracture.  IM nail.  Initial encounter.  EXAM: RIGHT FEMUR 2 VIEWS; DG C-ARM 61-120 MIN  COMPARISON:  Preoperative radiographs.  FINDINGS: Seven intraoperative fluoroscopic spot images of the right femur in frontal and lateral projections from the operating room during right femur IM nail insertion demonstrate placement of intra medullary nail with proximal and distal locking screws. There is improved fracture alignment. Total fluoroscopy time 1 min 48 seconds.  IMPRESSION: Intraoperative fluoroscopy during right femur fracture IM nail.   Electronically Signed   By: Rubye Oaks M.D.   On: 04/19/2014 04:24   Dg Femur, Min 2 Views Right  04/18/2014   CLINICAL DATA:  74 year old female who fell while transferring from wheelchair. Initial encounter.  EXAM: DG FEMUR 2+V*R*  COMPARISON:  None.  FINDINGS: Comminuted, impacted, and angulated fracture of the distal right femoral shaft. Overriding of  fragments up to 6 cm. Posterior displacement and angulation of the distal fragment. The fracture plane extends to the distal right femur metadiaphysis.  Proximal right femur appears to remain intact. Right knee not entirely included.  IMPRESSION: Severely comminuted, impacted, and angulated distal right femoral shaft fracture extending to the metadiaphysis.   Electronically Signed   By: Augusto Gamble M.D.   On: 04/18/2014 12:40   Dg Femur Port, Min 2 Views Right  04/19/2014   CLINICAL DATA:  Postoperative right femur IM nail.  EXAM: RIGHT FEMUR PORTABLE 1 VIEW  COMPARISON:  Radiographs 1 day prior.  FINDINGS: Intra medullary rod with distal and proximal locking screws traverse the comminuted distal femur shaft fracture. There is improved alignment compared to preoperative exam. Postsurgical changes seen in the soft tissue.  IMPRESSION: Intra medullary rod traversing comminuted distal femur fracture in improved alignment. No immediate postprocedural complication.   Electronically Signed   By: Rubye Oaks M.D.   On: 04/19/2014 04:22        Scheduled Meds: . cefTRIAXone (ROCEPHIN)  IV  1 g Intravenous Q24H  . cholecalciferol  2,000 Units Oral Daily  . collagenase   Topical Daily  . docusate sodium  100 mg Oral BID  . feeding supplement (ENSURE COMPLETE)  237 mL Oral BID BM  . feeding supplement (GLUCERNA SHAKE)  237 mL Oral TID BM  . fenofibrate  54 mg Oral Daily  . glipiZIDE  5 mg Oral Daily  . hydrochlorothiazide  25 mg Oral Daily  . insulin aspart  0-5 Units Subcutaneous QHS  . insulin aspart  0-9 Units Subcutaneous TID WC  . pantoprazole  40 mg Oral Q0600  . ramipril  10 mg Oral Daily  . rivaroxaban  15 mg Oral BID WC   Followed by  . [START ON 04/21/2014] rivaroxaban  20 mg Oral Q supper  . vitamin C  500 mg Oral Daily  . vitamin E  400 Units Oral Daily   Continuous Infusions: . sodium chloride 50 mL/hr at 04/20/14 1610    Principal Problem:   Right femoral fracture Active  Problems:   Mental retardation   HYPERTENSION, BENIGN SYSTEMIC  Dvt femoral (deep venous thrombosis)   DM (diabetes mellitus), type 2   Hyperlipidemia   Femoral condyle fracture   Hypokalemia    Time spent: 35 minutes.    Clint Lipps, MD [facility] Pager [phone]  If 7PM-7AM, please contact night-coverage [email or web address] Password [email or web address] 04/20/2014, 11:53 AM    LOS: 2 days

## 2014-04-20 NOTE — Discharge Instructions (Signed)
Orthopaedic Trauma Service Discharge Instructions   General Discharge Instructions  WEIGHT BEARING STATUS: nonweightbearing right leg  RANGE OF MOTION/ACTIVITY: range of motion as tolerated R hip, knee, ankle,   Diet: as you were eating previously.  Can use over the counter stool softeners and bowel preparations, such as Miralax, to help with bowel movements.  Narcotics can be constipating.  Be sure to drink plenty of fluids  STOP SMOKING OR USING NICOTINE PRODUCTS!!!!  As discussed nicotine severely impairs your body's ability to heal surgical and traumatic wounds but also impairs bone healing.  Wounds and bone heal by forming microscopic blood vessels (angiogenesis) and nicotine is a vasoconstrictor (essentially, shrinks blood vessels).  Therefore, if vasoconstriction occurs to these microscopic blood vessels they essentially disappear and are unable to deliver necessary nutrients to the healing tissue.  This is one modifiable factor that you can do to dramatically increase your chances of healing your injury.    (This means no smoking, no nicotine gum, patches, etc)  DO NOT USE NONSTEROIDAL ANTI-INFLAMMATORY DRUGS (NSAID'S)  Using products such as Advil (ibuprofen), Aleve (naproxen), Motrin (ibuprofen) for additional pain control during fracture healing can delay and/or prevent the healing response.  If you would like to take over the counter (OTC) medication, Tylenol (acetaminophen) is ok.  However, some narcotic medications that are given for pain control contain acetaminophen as well. Therefore, you should not exceed more than 4000 mg of tylenol in a day if you do not have liver disease.  Also note that there are may OTC medicines, such as cold medicines and allergy medicines that my contain tylenol as well.  If you have any questions about medications and/or interactions please ask your doctor/PA or your pharmacist.   PAIN MEDICATION USE AND EXPECTATIONS  You have likely been given narcotic  medications to help control your pain.  After a traumatic event that results in an fracture (broken bone) with or without surgery, it is ok to use narcotic pain medications to help control one's pain.  We understand that everyone responds to pain differently and each individual patient will be evaluated on a regular basis for the continued need for narcotic medications. Ideally, narcotic medication use should last no more than 6-8 weeks (coinciding with fracture healing).   As a patient it is your responsibility as well to monitor narcotic medication use and report the amount and frequency you use these medications when you come to your office visit.   We would also advise that if you are using narcotic medications, you should take a dose prior to therapy to maximize you participation.  IF YOU ARE ON NARCOTIC MEDICATIONS IT IS NOT PERMISSIBLE TO OPERATE A MOTOR VEHICLE (MOTORCYCLE/CAR/TRUCK/MOPED) OR HEAVY MACHINERY DO NOT MIX NARCOTICS WITH OTHER CNS (CENTRAL NERVOUS SYSTEM) DEPRESSANTS SUCH AS ALCOHOL       ICE AND ELEVATE INJURED/OPERATIVE EXTREMITY  Using ice and elevating the injured extremity above your heart can help with swelling and pain control.  Icing in a pulsatile fashion, such as 20 minutes on and 20 minutes off, can be followed.    Do not place ice directly on skin. Make sure there is a barrier between to skin and the ice pack.    Using frozen items such as frozen peas works well as the conform nicely to the are that needs to be iced.  USE AN ACE WRAP OR TED HOSE FOR SWELLING CONTROL  In addition to icing and elevation, Ace wraps or TED hose are used to  help limit and resolve swelling.  It is recommended to use Ace wraps or TED hose until you are informed to stop.    When using Ace Wraps start the wrapping distally (farthest away from the body) and wrap proximally (closer to the body)   Example: If you had surgery on your leg or thing and you do not have a splint on, start the ace  wrap at the toes and work your way up to the thigh        If you had surgery on your upper extremity and do not have a splint on, start the ace wrap at your fingers and work your way up to the upper arm  IF YOU ARE IN A SPLINT OR CAST DO NOT Bennett   If your splint gets wet for any reason please contact the office immediately. You may shower in your splint or cast as long as you keep it dry.  This can be done by wrapping in a cast cover or garbage back (or similar)  Do Not stick any thing down your splint or cast such as pencils, money, or hangers to try and scratch yourself with.  If you feel itchy take benadryl as prescribed on the bottle for itching  IF YOU ARE IN A CAM BOOT (BLACK BOOT)  You may remove boot periodically. Perform daily dressing changes as noted below.  Wash the liner of the boot regularly and wear a sock when wearing the boot. It is recommended that you sleep in the boot until told otherwise  CALL THE OFFICE WITH ANY QUESTIONS OR CONCERTS: [phone]     Discharge Pin Site Instructions  Dress pins daily with Kerlix roll starting on POD 2. Wrap the Kerlix so that it tamps the skin down around the pin-skin interface to prevent/limit motion of the skin relative to the pin.  (Pin-skin motion is the primary cause of pain and infection related to external fixator pin sites).  Remove any crust or coagulum that may obstruct drainage with a saline moistened gauze or soap and water.  After POD 3, if there is no discernable drainage on the pin site dressing, the interval for change can by increased to every other day.  You may shower with the fixator, cleaning all pin sites gently with soap and water.  If you have a surgical wound this needs to be completely dry and without drainage before showering.  The extremity can be lifted by the fixator to facilitate wound care and transfers.  Notify the office/Doctor if you experience increasing drainage, redness, or  pain from a pin site, or if you notice purulent (thick, snot-like) drainage.  Discharge Wound Care Instructions  Do NOT apply any ointments, solutions or lotions to pin sites or surgical wounds.  These prevent needed drainage and even though solutions like hydrogen peroxide kill bacteria, they also damage cells lining the pin sites that help fight infection.  Applying lotions or ointments can keep the wounds moist and can cause them to breakdown and open up as well. This can increase the risk for infection. When in doubt call the office.  Surgical incisions should be dressed daily.  If any drainage is noted, use one layer of adaptic, then gauze, Kerlix, and an ace wrap.  Once the incision is completely dry and without drainage, it may be left open to air out.  Showering may begin 36-48 hours later.  Cleaning gently with soap and water.  Traumatic wounds should be  dressed daily as well.    One layer of adaptic, gauze, Kerlix, then ace wrap.  The adaptic can be discontinued once the draining has ceased    If you have a wet to dry dressing: wet the gauze with saline the squeeze as much saline out so the gauze is moist (not soaking wet), place moistened gauze over wound, then place a dry gauze over the moist one, followed by Kerlix wrap, then ace wrap.

## 2014-04-20 NOTE — Progress Notes (Signed)
Inpatient Diabetes Program Recommendations  AACE/ADA: New Consensus Statement on Inpatient Glycemic Control (2013)  Target Ranges:  Prepandial:   less than 140 mg/dL      Peak postprandial:   less than 180 mg/dL (1-2 hours)      Critically ill patients:  140 - 180 mg/dL   Results for Crystal Robinson, Crystal Robinson (MRN [id number]) as of 04/20/2014 08:53  Ref. Range 04/19/2014 06:54 04/19/2014 12:12 04/19/2014 16:48 04/19/2014 21:30 04/20/2014 06:32  Glucose-Capillary Latest Range: 70-99 mg/dL 621187 (H) 308227 (H) 657238 (H) 225 (H) 135 (H)    Reason for assessment: elevated CBG  Diabetes history: Type 2  Outpatient Diabetes medications: Glipizide 5mg  q24hr.  Current orders for Inpatient glycemic control: Glipizide 5mg  daily, Novolog correction 0-9 units tid with meals, 0-5 units q hs.  Post meals blood sugars remain elevated despite current sensitive correction scale.  Please consider increasing the correction scale to moderate correction tid with meal.  Susette RacerJulie Hasson Gaspard, RN, BA, MHA, CDE Diabetes Coordinator Inpatient Diabetes Program  [phone] (Team Pager) [phone] [name]([place] Office) 04/20/2014 8:57 AM

## 2014-04-21 DIAGNOSIS — E876 Hypokalemia: Secondary | ICD-10-CM

## 2014-04-21 DIAGNOSIS — F79 Unspecified intellectual disabilities: Secondary | ICD-10-CM

## 2014-04-21 DIAGNOSIS — E785 Hyperlipidemia, unspecified: Secondary | ICD-10-CM

## 2014-04-21 LAB — GLUCOSE, CAPILLARY
GLUCOSE-CAPILLARY: 168 mg/dL — AB (ref 70–99)
GLUCOSE-CAPILLARY: 239 mg/dL — AB (ref 70–99)
Glucose-Capillary: 132 mg/dL — ABNORMAL HIGH (ref 70–99)
Glucose-Capillary: 179 mg/dL — ABNORMAL HIGH (ref 70–99)

## 2014-04-21 LAB — CBC
HCT: 27.5 % — ABNORMAL LOW (ref 36.0–46.0)
HEMOGLOBIN: 9.2 g/dL — AB (ref 12.0–15.0)
MCH: 30.3 pg (ref 26.0–34.0)
MCHC: 33.5 g/dL (ref 30.0–36.0)
MCV: 90.5 fL (ref 78.0–100.0)
Platelets: 206 10*3/uL (ref 150–400)
RBC: 3.04 MIL/uL — ABNORMAL LOW (ref 3.87–5.11)
RDW: 13.2 % (ref 11.5–15.5)
WBC: 8.3 10*3/uL (ref 4.0–10.5)

## 2014-04-21 LAB — BASIC METABOLIC PANEL
Anion gap: 8 (ref 5–15)
BUN: 7 mg/dL (ref 6–23)
CALCIUM: 9 mg/dL (ref 8.4–10.5)
CHLORIDE: 106 mmol/L (ref 96–112)
CO2: 23 mmol/L (ref 19–32)
Creatinine, Ser: 0.62 mg/dL (ref 0.50–1.10)
GFR calc Af Amer: >90 mL/min (ref >90)
GFR, EST NON AFRICAN AMERICAN: 89 mL/min — AB (ref >90)
Glucose, Bld: 154 mg/dL — ABNORMAL HIGH (ref 70–99)
POTASSIUM: 4.4 mmol/L (ref 3.5–5.1)
SODIUM: 137 mmol/L (ref 135–145)

## 2014-04-21 MED ORDER — CEFUROXIME AXETIL 500 MG PO TABS
500.0000 mg | ORAL_TABLET | Freq: Two times a day (BID) | ORAL | Status: DC
  Administered 2014-04-21 – 2014-04-22 (×2): 500 mg via ORAL
  Filled 2014-04-21 (×4): qty 1

## 2014-04-21 NOTE — Progress Notes (Signed)
Case Manager follow up visit with patient/family.Patients brother Rosalia HammersRay reports they have received all the patients   DME except the mattress delivery window given to family already.No further CM needs identified.

## 2014-04-21 NOTE — Progress Notes (Signed)
PROGRESS NOTE    Crystal Robinson ZOX:[id number] DOB: [birth year] DOA: 04/18/2014 PCP: Jackie Plum, MD   Subjective: Discussed with brother at bedside, he received all DME is except mattress. We'll keep in the hospital overnight, patient is on Xarelto, hemoglobin dropping significantly.  HPI/Brief narrative 74 year old female with history of severe developmental disability, mostly mute, mostly bedbound, DM 2, HTN, recent diagnosis with extensive right lower extremity DVT and started on Xarelto, presented with fall when she was being assisted from bed to potty chair and sustained a right femur fracture with associated pain. Patient status post IVC filter and right femur IM nail.  Assessment/Plan:  Right femoral shaft fracture, sustained post mechanical fall:  Status post ORIF/IM nail on 04/19/14.  As per orthopedics: NWB 6 weeks , unrestricted knee ROM,. PT and OT evaluation, patient essentially bed to chair transfers only & okay to resume Xarelto. Patient baseline unable to ambulate on her own, family elected to take her back home. She will need PT/OT at home, discussed with family, brother and sister-in-law at bedside.  Extensive right lower extremity DVT:  Due to recently diagnosed extensive right lower extremity DVT, fractured femur on the same side. IVC filter placed on 04/18/14, to prevent any PE if any DVT accidentally dislodged during manuplation of the same leg. Xarelto resumed. Has anemia, hopefully this is secondary to the perioperative blood loss. If still bleeding or hemoglobin dropping significantly might need to take her off of Xarelto for some time.  Acute post hemorrhagic anemia:  This is likely secondary to perioperative blood loss and defect of IV fluids. Hemoglobin dropped from 11.4-10.1, today is 9.2. I will keep in the hospital, check hemoglobin in the morning if still dropping she might need transfusion.  Severe developmental disability: As per brother, mental  status at baseline and patient is fully dependent on ADLs.  Essential hypertension: Controlled. Continue HCTZ and ramipril.  Uncontrolled DM 2: Continue Glucotrol and SSI.  Hypokalemia: Replace with oral supplements, potassium is 4.4 today.  Hyperlipidemia: Continue TriCor  UTI: Escherichia coli UTI, will switch Rocephin to oral Ceftin today. Treat for total 5 days.   Code Status: Full Family Communication: Discussed with brother Crystal Robinson at bedside. Disposition Plan: Home when medically stable.   Consultants:  Interventional radiology  Orthopedics  Procedures:  IVC filter placement 04/18/14  ORIF/IM nail right femur fracture 04/19/14  Foley catheter: Consider DC 04/20/14  Antibiotics:  IV Rocephin     Objective: Filed Vitals:   04/20/14 0524 04/20/14 1247 04/20/14 2113 04/21/14 0612  BP: 136/56 129/52 132/54 134/56  Pulse: 106 92 96 94  Temp: 99.8 F (37.7 C) 98.2 F (36.8 C) 98.9 F (37.2 C) 98.8 F (37.1 C)  TempSrc:      Resp: Height:      Weight:      SpO2: 95% 100% 98% 98%    Intake/Output Summary (Last 24 hours) at 04/21/14 1133 Last data filed at 04/21/14 0800  Gross per 24 hour  Intake   1040 ml  Output   2350 ml  Net  -1310 ml   Filed Weights   04/18/14 1330  Weight: 80.74 kg (178 lb)     Exam:  General exam: Pleasant elderly female lying comfortably supine in bed.  Respiratory system: Clear. No increased work of breathing. Cardiovascular system: S1 & S2 heard, RRR. No JVD, murmurs, gallops, clicks or pedal edema. Gastrointestinal system: Abdomen is nondistended, soft and nontender. Normal bowel sounds heard. Foley +  Central nervous system: Alert but nonverbal. Does not follow instructions. No focal neurological deficits. Extremities: Symmetric 5 x 5 power. Right lower extremity surgical dressing site clean and dry.   Data Reviewed: Basic Metabolic Panel:  Recent Labs Lab 04/18/14 1135 04/19/14 0501 04/20/14 0457  04/21/14 0439  NA 144 138 139 137  K 2.9* 4.4 3.7 4.4  CL 105 103 105 106  CO2 GLUCOSE 164* 197* 144* 154*  BUN 10 9 5* 7  CREATININE 0.87 0.83 0.58 0.62  CALCIUM 10.1 9.1 9.2 9.0  MG  --  1.7  --   --    Liver Function Tests:  Recent Labs Lab 04/18/14 1135  AST 31  ALT 22  ALKPHOS 57  BILITOT 1.0  PROT 6.1  ALBUMIN 3.4*   No results for input(s): LIPASE, AMYLASE in the last 168 hours. No results for input(s): AMMONIA in the last 168 hours. CBC:  Recent Labs Lab 04/18/14 1135 04/19/14 0501 04/20/14 0457 04/21/14 0439  WBC 11.5* 12.0* 8.8 8.3  NEUTROABS 9.5*  --   --   --   HGB 14.2 11.4* 10.1* 9.2*  HCT 41.7 34.1* 30.1* 27.5*  MCV 88.7 91.2 90.1 90.5  PLT 313 310 243 206   Cardiac Enzymes: No results for input(s): CKTOTAL, CKMB, CKMBINDEX, TROPONINI in the last 168 hours. BNP (last 3 results) No results for input(s): PROBNP in the last 8760 hours. CBG:  Recent Labs Lab 04/20/14 0632 04/20/14 1133 04/20/14 1636 04/20/14 2136 04/21/14 0636  GLUCAP 135* 213* 211* 224* 132*    Recent Results (from the past 240 hour(s))  Surgical pcr screen     Status: None   Collection Time: 04/18/14  2:56 PM  Result Value Ref Range Status   MRSA, PCR NEGATIVE NEGATIVE Final   Staphylococcus aureus NEGATIVE NEGATIVE Final    Comment:        The Xpert SA Assay (FDA approved for NASAL specimens in patients over 60 years of age), is one component of a comprehensive surveillance program.  Test performance has been validated by [facility] for patients greater than or equal to 46 year old. It is not intended to diagnose infection nor to guide or monitor treatment.   Urine culture     Status: None   Collection Time: 04/18/14  3:27 PM  Result Value Ref Range Status   Specimen Description URINE, CATHETERIZED  Final   Special Requests Normal  Final   Colony Count   Final    >=100,000 COLONIES/ML Performed at [facility]    Culture   Final      ESCHERICHIA COLI Performed at [facility]    Report Status 04/20/2014 FINAL  Final   Organism ID, Bacteria ESCHERICHIA COLI  Final      Susceptibility   Escherichia coli - MIC*    AMPICILLIN >=32 RESISTANT Resistant     CEFAZOLIN <=4 SENSITIVE Sensitive     CEFTRIAXONE <=1 SENSITIVE Sensitive     CIPROFLOXACIN <=0.25 SENSITIVE Sensitive     GENTAMICIN <=1 SENSITIVE Sensitive     LEVOFLOXACIN <=0.12 SENSITIVE Sensitive     NITROFURANTOIN <=16 SENSITIVE Sensitive     TOBRAMYCIN <=1 SENSITIVE Sensitive     TRIMETH/SULFA <=20 SENSITIVE Sensitive     PIP/TAZO <=4 SENSITIVE Sensitive     * ESCHERICHIA COLI           Studies: No results found.      Scheduled Meds: . cefTRIAXone (ROCEPHIN)  IV  1 g Intravenous Q24H  . cholecalciferol  2,000 Units Oral Daily  . collagenase   Topical Daily  . docusate sodium  100 mg Oral BID  . feeding supplement (ENSURE COMPLETE)  237 mL Oral BID BM  . feeding supplement (GLUCERNA SHAKE)  237 mL Oral TID BM  . fenofibrate  54 mg Oral Daily  . glipiZIDE  5 mg Oral Daily  . hydrochlorothiazide  25 mg Oral Daily  . insulin aspart  0-5 Units Subcutaneous QHS  . insulin aspart  0-9 Units Subcutaneous TID WC  . pantoprazole  40 mg Oral Q0600  . ramipril  10 mg Oral Daily  . rivaroxaban  20 mg Oral Q supper  . vitamin C  500 mg Oral Daily  . vitamin E  400 Units Oral Daily   Continuous Infusions:    Principal Problem:   Right femoral fracture Active Problems:   Mental retardation   HYPERTENSION, BENIGN SYSTEMIC   Dvt femoral (deep venous thrombosis)   DM (diabetes mellitus), type 2   Hyperlipidemia   Femoral condyle fracture   Hypokalemia    Time spent: 35 minutes.    Clint LippsELMAHI,Nixon Sparr A, MD [facility] Pager [phone]  If 7PM-7AM, please contact night-coverage [email or web address] Password [email or web address] 04/21/2014, 11:33 AM    LOS: 3 days

## 2014-04-21 NOTE — Progress Notes (Signed)
Patient presents temp 100 BP 147/73 pulse 117 O2 97. Tylenol was given, ice packs applied to groin and under arm.  MD notified. Will continue to monitor patient.

## 2014-04-22 DIAGNOSIS — S72411A Displaced unspecified condyle fracture of lower end of right femur, initial encounter for closed fracture: Secondary | ICD-10-CM

## 2014-04-22 LAB — BASIC METABOLIC PANEL
Anion gap: 7 (ref 5–15)
BUN: 7 mg/dL (ref 6–23)
CO2: 26 mmol/L (ref 19–32)
Calcium: 9.7 mg/dL (ref 8.4–10.5)
Chloride: 105 mmol/L (ref 96–112)
Creatinine, Ser: 0.59 mg/dL (ref 0.50–1.10)
GFR calc Af Amer: >90 mL/min (ref >90)
Glucose, Bld: 194 mg/dL — ABNORMAL HIGH (ref 70–99)
Potassium: 4 mmol/L (ref 3.5–5.1)
SODIUM: 138 mmol/L (ref 135–145)

## 2014-04-22 LAB — TYPE AND SCREEN
ABO/RH(D): O POS
Antibody Screen: NEGATIVE
UNIT DIVISION: 0
UNIT DIVISION: 0
Unit division: 0
Unit division: 0

## 2014-04-22 LAB — CBC
HCT: 30.2 % — ABNORMAL LOW (ref 36.0–46.0)
HEMOGLOBIN: 9.9 g/dL — AB (ref 12.0–15.0)
MCH: 29.6 pg (ref 26.0–34.0)
MCHC: 32.8 g/dL (ref 30.0–36.0)
MCV: 90.4 fL (ref 78.0–100.0)
Platelets: 279 10*3/uL (ref 150–400)
RBC: 3.34 MIL/uL — ABNORMAL LOW (ref 3.87–5.11)
RDW: 13 % (ref 11.5–15.5)
WBC: 7.8 10*3/uL (ref 4.0–10.5)

## 2014-04-22 LAB — GLUCOSE, CAPILLARY: GLUCOSE-CAPILLARY: 166 mg/dL — AB (ref 70–99)

## 2014-04-22 MED ORDER — CEFUROXIME AXETIL 500 MG PO TABS: 500.0000 mg | ORAL_TABLET | Freq: Two times a day (BID) | ORAL | Status: DC

## 2014-04-22 MED ORDER — HYDROCODONE-ACETAMINOPHEN 5-325 MG PO TABS: 1.0000 | ORAL_TABLET | Freq: Four times a day (QID) | ORAL | Status: DC | PRN

## 2014-04-22 NOTE — Progress Notes (Signed)
Subjective: 4 Days Post-Op Procedure(s) (LRB): INTRAMEDULLARY  RETROGRADE FEMORAL NAILING (Right) Patient reports pain as mild.  Patient resting comfortably   Objective: Vital signs in last 24 hours: Temp:  [98.7 F (37.1 C)-100 F (37.8 C)] 98.7 F (37.1 C) (02/07 0657) Pulse Rate:  [95-117] 95 (02/07 0657) Resp:  [16-18] 16 (02/07 0657) BP: (98-147)/(64-123) 98/64 mmHg (02/07 0657) SpO2:  [95 %-97 %] 95 % (02/07 0657)  Intake/Output from previous day: 02/06 0701 - 02/07 0700 In: 320 [P.O.:320] Out: -  Intake/Output this shift:     Recent Labs  04/20/14 0457 04/21/14 0439 04/22/14 0514  HGB 10.1* 9.2* 9.9*    Recent Labs  04/21/14 0439 04/22/14 0514  WBC 8.3 7.8  RBC 3.04* 3.34*  HCT 27.5* 30.2*  PLT 206 279    Recent Labs  04/21/14 0439 04/22/14 0514  NA 137 138  K 4.4 4.0  CL 106 105  CO2 23 26  BUN 7 7  CREATININE 0.62 0.59  GLUCOSE 154* 194*  CALCIUM 9.0 9.7   No results for input(s): LABPT, INR in the last 72 hours.  Neurovascular intact Sensation intact distally Intact pulses distally Compartment soft  Moderate drainage noted through bandage Dressing changed by me today  Assessment/Plan: 4 Days Post-Op Procedure(s) (LRB): INTRAMEDULLARY  RETROGRADE FEMORAL NAILING (Right) Advance diet Up with therapy  NWB RLE-ok for knee ROM Continue wound care Ted hose BLE prior to d/c Follow up 2-3 weeks with Dr. Carola FrostHandy for suture removal Continue plan per medicine  Dub MikesStanbery, Judie PetitM. Lindsey 04/22/2014, 9:52 AM

## 2014-04-22 NOTE — Progress Notes (Signed)
CM spoke with patients brother Rosalia HammersRay he reports they received the patients mattress yesterday.No further CM needs identified at this time.

## 2014-04-22 NOTE — Discharge Summary (Signed)
Physician Discharge Summary  Crystal Robinson NFA:[id number] DOB: 1940-06-21 DOA: 04/18/2014  PCP: Jackie Plum, MD  Admit date: 04/18/2014 Discharge date: 04/22/2014  Time spent: 40 minutes  Recommendations for Outpatient Follow-up:  1. Follow-up with Dr. Carola Frost in 2 weeks. 2. Finish 5 more days of antibiotics. 3. Nonweightbearing x6 weeks, or otherwise as instructed by Dr. Carola Frost. 4. IVC filter placed on 04/18/14, please consider removal in 3-6 months, continue Xarelto for now.  Discharge Diagnoses:  Principal Problem:   Right femoral fracture Active Problems:   Mental retardation   HYPERTENSION, BENIGN SYSTEMIC   Dvt femoral (deep venous thrombosis)   DM (diabetes mellitus), type 2   Hyperlipidemia   Femoral condyle fracture   Hypokalemia   Discharge Condition: Stable  Diet recommendation: Heart healthy  Filed Weights   04/18/14 1330  Weight: 80.74 kg (178 lb)    History of present illness:  Crystal Robinson is a 74 y.o. female, with a past medical history of developmental disability, diabetes, hypertension as well as a recent diagnosis of extensive right lower extremity DVT on Xarelto who was sent to the ER from her home after experiencing a fall from her wheelchair. This occurred during a wheelchair transfer. The patient initially collapsed to her knees seemed to be okay but immediately after going to her knee she leaned backwards while turning her head; family nearby heard a pop that emanated from the right leg area. The patient was endorsing pain in the right leg. She was transferred by EMS, she required Fentanyl for pain en route. Evaluation in the ER revealed a normotensive patient who was not hypoxic. Right femur x-ray revealed a severely comminuted and impacted/angulated distal right femoral shaft fracture extending to the metadiaphysis. ER physician contacted Dr. Carola Frost with orthopedics. Portable chest x-ray revealed mild bibasilar atelectasis without any acute  findings.  Hospital Course:    Right femoral shaft fracture, sustained post mechanical fall:  Status post ORIF/IM nail on 04/19/14.  As per orthopedics: NWB 6 weeks , unrestricted knee ROM,. PT and OT evaluation, patient essentially bed to chair transfers only. Patient baseline unable to ambulate on her own, family elected to take her back home. She will need PT/OT at home, discussed with family, brother and sister-in-law at bedside. DMEs delivered including hospital bed, 3 in 1 and a hoyer.  Extensive right lower extremity DVT:  Due to recently diagnosed extensive right lower extremity DVT, fractured femur on the same side. IVC filter placed on 04/18/14, to prevent any PE if any DVT accidentally dislodged during manuplation of the same leg. Xarelto resumed. Please consider removal of the IVCs filter in 3-6 months.  Acute post hemorrhagic anemia:  This is likely secondary to perioperative blood loss and defect of IV fluids. Hemoglobin dropped from 11.4-10.1. This is likely perioperative loss, hemoglobin today is 9.9. Hemoglobin is better than yesterday, Xarelto continued.  Severe developmental disability: As per brother, mental status at baseline and patient is fully dependent on ADLs.  Essential hypertension: Controlled. Continue HCTZ and ramipril.  Uncontrolled DM 2: Continue Glucotrol and SSI.  Hypokalemia: Replace with oral supplements, potassium is 4.4 today.  Hyperlipidemia: Continue TriCor  UTI: Escherichia coli UTI, will switch Rocephin to oral Ceftin today. Treat for total 5 days.    Procedures:  Intramedullary nailing of the right femur and tibial lysis of the right quadriceps  Consultations:  Orthopedics  Discharge Exam: Filed Vitals:   04/22/14 0657  BP: 98/64  Pulse: 95  Temp: 98.7 F (37.1 C)  Resp: 16  General: Alert and awake, non-verbal, does not ask questions, brother at bedside speaks for her. HEENT: anicteric sclera, pupils reactive to  light and accommodation, EOMI CVS: S1-S2 clear, no murmur rubs or gallops Chest: clear to auscultation bilaterally, no wheezing, rales or rhonchi Abdomen: soft nontender, nondistended, normal bowel sounds, no organomegaly Extremities: no cyanosis, clubbing or edema noted bilaterally Neuro: Cranial nerves II-XII intact, no focal neurological deficits  Discharge Instructions   Discharge Instructions    Active range of motion    Complete by:  As directed   Knee range of motion     Diet - low sodium heart healthy    Complete by:  As directed      Discharge wound care:    Complete by:  As directed   DISCHARGE WOUND CARE INSTRUCTIONS  Discharge Wound Care Instructions  Do NOT apply any ointments, solutions or lotions to pin sites or surgical wounds.  These prevent needed drainage and even though solutions like hydrogen peroxide kill bacteria, they also damage cells lining the pin sites that help fight infection.  Applying lotions or ointments can keep the wounds moist and can cause them to breakdown and open up as well. This can increase the risk for infection. When in doubt call the office.  Surgical incisions should be dressed daily.  If any drainage is noted, use one layer of adaptic, then gauze, Kerlix, and an ace wrap.  Once the incision is completely dry and without drainage, it may be left open to air out.  Showering may begin 36-48 hours later.  Cleaning gently with soap and water.  Traumatic wounds should be dressed daily as well.    One layer of adaptic, gauze, Kerlix, then ace wrap.  The adaptic can be discontinued once the draining has ceased    If you have a wet to dry dressing: wet the gauze with saline the squeeze as much saline out so the gauze is moist (not soaking wet), place moistened gauze over wound, then place a dry gauze over the moist one, followed by Kerlix wrap, then ace wrap.  CALL OFFICE WITH QUESTIONS OR CONCERNS [phone]     Increase activity slowly     Complete by:  As directed      Non weight bearing    Complete by:  As directed   Laterality:  right  Extremity:  Lower     Passive range of motion    Complete by:  As directed   Knee range of motion as tolerate          Current Discharge Medication List    START taking these medications   Details  acetaminophen (TYLENOL) 325 MG tablet Take 2 tablets (650 mg total) by mouth every 6 (six) hours as needed for mild pain (or Fever >/= 101).    cefUROXime (CEFTIN) 500 MG tablet Take 1 tablet (500 mg total) by mouth 2 (two) times daily with a meal. Qty: 10 tablet, Refills: 0    HYDROcodone-acetaminophen (NORCO/VICODIN) 5-325 MG per tablet Take 1-2 tablets by mouth every 6 (six) hours as needed for moderate pain. Qty: 30 tablet, Refills: 0      CONTINUE these medications which have NOT CHANGED   Details  Cholecalciferol (VITAMIN D3) 2000 UNITS TABS Take 1 tablet by mouth daily.    diphenhydramine-acetaminophen (TYLENOL PM) 25-500 MG TABS Take 3 tablets by mouth at bedtime.     fenofibrate (TRICOR) 145 MG tablet Take 145 mg by mouth daily.    Garlic  1000 MG CAPS Take 1,000 mg by mouth daily.    glipiZIDE (GLUCOTROL XL) 5 MG 24 hr tablet Take 1 tablet (5 mg total) by mouth daily. Qty: 90 tablet, Refills: 2   Associated Diagnoses: Type II or unspecified type diabetes mellitus without mention of complication, not stated as uncontrolled    hydrochlorothiazide (HYDRODIURIL) 25 MG tablet Take 1 tablet (25 mg total) by mouth daily. Qty: 90 tablet, Refills: 2   Associated Diagnoses: Essential hypertension, benign    Multiple Vitamins-Minerals (MULTIVITAMIN & MINERAL PO) Take 1 tablet by mouth daily.    ramipril (ALTACE) 10 MG capsule Take 1 capsule (10 mg total) by mouth daily. Qty: 90 capsule, Refills: 2   Associated Diagnoses: Type II or unspecified type diabetes mellitus without mention of complication, not stated as uncontrolled    Rivaroxaban (XARELTO STARTER PACK) 15 & 20 MG  TBPK Take as directed on package: Start with one 15mg  tablet by mouth twice a day with food. On Day 22, switch to one 20mg  tablet once a day with food. Qty: 51 each, Refills: 0    rivaroxaban (XARELTO) 20 MG TABS tablet Take 1 tablet (20 mg total) by mouth daily with supper. Qty: 30 tablet, Refills: 0    vitamin C (ASCORBIC ACID) 500 MG tablet Take 500 mg by mouth daily.    vitamin E 400 UNIT capsule Take 400 Units by mouth daily.    pantoprazole (PROTONIX) 40 MG tablet Take 1 tablet (40 mg total) by mouth daily at 6 (six) AM. Qty: 30 tablet, Refills: 0      STOP taking these medications     acetaminophen (TYLENOL) 650 MG CR tablet        No Known Allergies Follow-up Information    Follow up with HANDY,MICHAEL H, MD In 2 weeks.   Specialty:  Orthopedic Surgery   Why:  For wound re-check, For suture removal   Contact information:   [address] [place] [address] [phone]        The results of significant diagnostics from this hospitalization (including imaging, microbiology, ancillary and laboratory) are listed below for reference.    Significant Diagnostic Studies: Dg Chest 1 View  04/18/2014   CLINICAL DATA:  Fall today from wheelchair. Leg injury. Mentally handicapped. Initial encounter.  EXAM: CHEST - 1 VIEW  COMPARISON:  None.  FINDINGS: 1202 hr. There is mild patient rotation to the right. Allowing for this, the heart size and mediastinal contours are normal There is mild atelectasis at both lung bases, but no confluent airspace opacity, pneumothorax or significant pleural effusion. No acute fractures identified. Telemetry leads overlie the chest.  IMPRESSION: Mild bibasilar atelectasis.  No definite acute findings.   Electronically Signed   By: Roxy Horseman M.D.   On: 04/18/2014 12:39   Dg Abd 1 View  04/18/2014   CLINICAL DATA:  74 year old female status post IVC filter placement. Evaluate filter strut position.  EXAM: ABDOMEN - 1 VIEW   COMPARISON:  Intraoperative placement images obtained earlier today  FINDINGS: Bard Denali IVC filter projects over the right aspect of the spine in the expected location. The retrieval hook lines up with the inferior aspect of the L2 spinous process. Two of the Central struts appear detached suggesting partial crossing of the legs. There is no evidence of movement compared to the intraoperative placement images.  The bowel gas pattern is unremarkable. No acute osseous abnormality. Multilevel degenerative disc disease and facet arthropathy. The bones appear osteopenic.  IMPRESSION: Well-positioned IVC filter as described above. There may be partial crossing of 2 of the midline struts.   Electronically Signed   By: Malachy Moan M.D.   On: 04/18/2014 20:00   Ir Ivc Filter Plmt / S&i /img Guid/mod Sed  04/18/2014   INDICATION: 74 year old with a right lower extremity DVT and right femur fracture. The patient will need to stop anticoagulation due to the fracture and needs pulmonary embolism prophylaxis.  EXAM: IVC FILTER PLACEMENT; IVC VENOGRAM; ULTRASOUND FOR VASCULAR ACCESS  Physician: Rachelle Hora. Henn, MD  FLUOROSCOPY TIME:  5 min and 54 seconds  MEDICATIONS: 2 mg versed, 75 mcg fentanyl.  Insert moderate set  ANESTHESIA/SEDATION: Moderate sedation time: 45 min minutes  CONTRAST:  45 mL Omnipaque 300  PROCEDURE: Informed consent was obtained for an IVC venogram and filter placement by the family. Ultrasound demonstrated a patent right internal jugular vein. Ultrasound images were obtained for documentation. The right side of the neck was prepped and draped in a sterile fashion. Maximal barrier sterile technique was utilized including caps, mask, sterile gowns, sterile gloves, sterile drape, hand hygiene and skin antiseptic. The skin was anesthetized with 1% lidocaine. A 21 gauge needle was directed into the vein with ultrasound guidance and a micropuncture dilator set was placed. A wire was advanced into the IVC.  The filter sheath was advanced over the wire into the IVC. An IVC venogram was performed. Fluoroscopic images were obtained for documentation. A Bard Denali filter was deployed below the lowest renal vein. J wire was advanced beyond the filter in order to confirm stability of the filter. A follow-up venogram was performed and the vascular sheath was removed with manual compression.  FINDINGS: IVC was patent. Bilateral renal veins were identified. The filter was deployed below the lowest renal vein. IVC filter is appropriately positioned below the renal veins. However, some of the filter legs are in close proximity and difficult to know if the legs are crossed or stuck to each other. A J wire was advanced through the filter and the leg positions did not change.  COMPLICATIONS: None  IMPRESSION: Successful placement of a retrievable IVC filter.  There is concern that some of the filter legs may be crossed but this is difficult to evaluate. Will plan for follow-up abdominal radiographs to better evaluate the filter placement and see if the leg positions change.   Electronically Signed   By: Richarda Overlie M.D.   On: 04/18/2014 19:12   Dg Abd Portable 1v  04/19/2014   CLINICAL DATA:  IVC filter  EXAM: PORTABLE ABDOMEN - 1 VIEW  COMPARISON:  Portable exam 0755 hr compared to 04/18/2004  FINDINGS: Patient is severely rotated to the LEFT.  An IVC filter is identified projecting further over the spine due to rotation.  Due to rotation and superimposition with the spine, unable to assess the configuration of the limbs of the IVC filter.  Visualized bowel gas pattern is unremarkable.  IMPRESSION: IVC filter is suboptimally visualized due to superimposition with the spine and significant patient rotation.   Electronically Signed   By: Ulyses Southward M.D.   On: 04/19/2014 08:47   Dg C-arm 61-120 Min  04/19/2014   CLINICAL DATA:  Right femur fracture.  IM nail.  Initial encounter.  EXAM: RIGHT FEMUR 2 VIEWS; DG C-ARM 61-120 MIN   COMPARISON:  Preoperative radiographs.  FINDINGS: Seven intraoperative fluoroscopic spot images of the right femur in frontal and lateral projections from the operating room during right femur  IM nail insertion demonstrate placement of intra medullary nail with proximal and distal locking screws. There is improved fracture alignment. Total fluoroscopy time 1 min 48 seconds.  IMPRESSION: Intraoperative fluoroscopy during right femur fracture IM nail.   Electronically Signed   By: Rubye Oaks M.D.   On: 04/19/2014 04:24   Dg Femur, Min 2 Views Right  04/19/2014   CLINICAL DATA:  Right femur fracture.  IM nail.  Initial encounter.  EXAM: RIGHT FEMUR 2 VIEWS; DG C-ARM 61-120 MIN  COMPARISON:  Preoperative radiographs.  FINDINGS: Seven intraoperative fluoroscopic spot images of the right femur in frontal and lateral projections from the operating room during right femur IM nail insertion demonstrate placement of intra medullary nail with proximal and distal locking screws. There is improved fracture alignment. Total fluoroscopy time 1 min 48 seconds.  IMPRESSION: Intraoperative fluoroscopy during right femur fracture IM nail.   Electronically Signed   By: Rubye Oaks M.D.   On: 04/19/2014 04:24   Dg Femur, Min 2 Views Right  04/18/2014   CLINICAL DATA:  74 year old female who fell while transferring from wheelchair. Initial encounter.  EXAM: DG FEMUR 2+V*R*  COMPARISON:  None.  FINDINGS: Comminuted, impacted, and angulated fracture of the distal right femoral shaft. Overriding of fragments up to 6 cm. Posterior displacement and angulation of the distal fragment. The fracture plane extends to the distal right femur metadiaphysis.  Proximal right femur appears to remain intact. Right knee not entirely included.  IMPRESSION: Severely comminuted, impacted, and angulated distal right femoral shaft fracture extending to the metadiaphysis.   Electronically Signed   By: Augusto Gamble M.D.   On: 04/18/2014 12:40   Dg  Femur Port, Min 2 Views Right  04/19/2014   CLINICAL DATA:  Postoperative right femur IM nail.  EXAM: RIGHT FEMUR PORTABLE 1 VIEW  COMPARISON:  Radiographs 1 day prior.  FINDINGS: Intra medullary rod with distal and proximal locking screws traverse the comminuted distal femur shaft fracture. There is improved alignment compared to preoperative exam. Postsurgical changes seen in the soft tissue.  IMPRESSION: Intra medullary rod traversing comminuted distal femur fracture in improved alignment. No immediate postprocedural complication.   Electronically Signed   By: Rubye Oaks M.D.   On: 04/19/2014 04:22    Microbiology: Recent Results (from the past 240 hour(s))  Surgical pcr screen     Status: None   Collection Time: 04/18/14  2:56 PM  Result Value Ref Range Status   MRSA, PCR NEGATIVE NEGATIVE Final   Staphylococcus aureus NEGATIVE NEGATIVE Final    Comment:        The Xpert SA Assay (FDA approved for NASAL specimens in patients over 4 years of age), is one component of a comprehensive surveillance program.  Test performance has been validated by [facility] for patients greater than or equal to 46 year old. It is not intended to diagnose infection nor to guide or monitor treatment.   Urine culture     Status: None   Collection Time: 04/18/14  3:27 PM  Result Value Ref Range Status   Specimen Description URINE, CATHETERIZED  Final   Special Requests Normal  Final   Colony Count   Final    >=100,000 COLONIES/ML Performed at [facility]    Culture   Final    ESCHERICHIA COLI Performed at [facility]    Report Status 04/20/2014 FINAL  Final   Organism ID, Bacteria ESCHERICHIA COLI  Final      Susceptibility  Escherichia coli - MIC*    AMPICILLIN >=32 RESISTANT Resistant     CEFAZOLIN <=4 SENSITIVE Sensitive     CEFTRIAXONE <=1 SENSITIVE Sensitive     CIPROFLOXACIN <=0.25 SENSITIVE Sensitive     GENTAMICIN <=1 SENSITIVE Sensitive     LEVOFLOXACIN  <=0.12 SENSITIVE Sensitive     NITROFURANTOIN <=16 SENSITIVE Sensitive     TOBRAMYCIN <=1 SENSITIVE Sensitive     TRIMETH/SULFA <=20 SENSITIVE Sensitive     PIP/TAZO <=4 SENSITIVE Sensitive     * ESCHERICHIA COLI     Labs: Basic Metabolic Panel:  Recent Labs Lab 04/18/14 1135 04/19/14 0501 04/20/14 0457 04/21/14 0439 04/22/14 0514  NA 144 138 139 137 138  K 2.9* 4.4 3.7 4.4 4.0  CL 105 103 105 106 105  CO2 31 24 26 23 26   GLUCOSE 164* 197* 144* 154* 194*  BUN 10 9 5* 7 7  CREATININE 0.87 0.83 0.58 0.62 0.59  CALCIUM 10.1 9.1 9.2 9.0 9.7  MG  --  1.7  --   --   --    Liver Function Tests:  Recent Labs Lab 04/18/14 1135  AST 31  ALT 22  ALKPHOS 57  BILITOT 1.0  PROT 6.1  ALBUMIN 3.4*   No results for input(s): LIPASE, AMYLASE in the last 168 hours. No results for input(s): AMMONIA in the last 168 hours. CBC:  Recent Labs Lab 04/18/14 1135 04/19/14 0501 04/20/14 0457 04/21/14 0439 04/22/14 0514  WBC 11.5* 12.0* 8.8 8.3 7.8  NEUTROABS 9.5*  --   --   --   --   HGB 14.2 11.4* 10.1* 9.2* 9.9*  HCT 41.7 34.1* 30.1* 27.5* 30.2*  MCV 88.7 91.2 90.1 90.5 90.4  PLT 313 310 243 206 279   Cardiac Enzymes: No results for input(s): CKTOTAL, CKMB, CKMBINDEX, TROPONINI in the last 168 hours. BNP: BNP (last 3 results) No results for input(s): BNP in the last 8760 hours.  ProBNP (last 3 results) No results for input(s): PROBNP in the last 8760 hours.  CBG:  Recent Labs Lab 04/21/14 0636 04/21/14 1136 04/21/14 1645 04/21/14 2216 04/22/14 0701  GLUCAP 132* 168* 239* 179* 166*       Signed:  Chapman Matteucci A  [facility] 04/22/2014, 10:23 AM

## 2014-04-22 NOTE — Clinical Social Work Note (Signed)
CSW consulted to assist family with resources available for respite care in the community. CSW met with patient's brother Jeanell Sparrow and his wife who was present at bedside. Per Ray's wife, they are seeking respite for a few hours during the day or may overnight at the most. CSW educated Ray and his wife on respite services and provided resources to include private duty nursing and [employer] [facility] [place]. Ray states they will also speak with patient's DSS case manager for assistance with SCAT application as patient will need transportation because she cannot bend her leg and their current vehicle would require that she bend her leg. Ray's wife states, "we have had her for 16 years and have never asked anyone for anything. Now we really need the help for her because of her injuries." CSW acknowledged Ray and his wife for using their resources to meet the patient's needs and recognizing areas in which they would benefit from assistance. Ray's and his wife responded by explaining to CSW their faith in "God" and his amazing mercy and blessings have keep them pressing forward. Ray's wife attributed their demonstration of consistency in providing care for patient to God's consistency in providing love to them.   CSW further discussed transportation assistance with Ray and wife. Per Ray and wife patient is unable to bend leg to sit in car for transport home and they are agreeable to ambulance assistance. CSW verified address and prepared EMS packet. CSW placed packet in patient's shadow chart and made RN Marye Round) aware. CSW to arrange transportation via [employer]. No further needs. CSW signing off.  [place], [place] Weekend Clinical Social Worker [phone]

## 2014-04-22 NOTE — Progress Notes (Signed)
Crystal DeterPatricia Robinson discharged home per MD order. Discharge instructions reviewed and discussed with patient. All questions and concerns answered. Copy of instructions and scripts given to patient. IV removed.  Patient escorted by ambulance with family. No distress noted upon discharge.   Crystal Robinson, Crystal Robinson R 04/22/2014 1:48 PM

## 2014-05-01 LAB — VITAMIN D 1,25 DIHYDROXY
VITAMIN D3 1, 25 (OH): 30
Vitamin D 1, 25 (OH)2 Total: 30
Vitamin D2 1, 25 (OH)2: <10

## 2014-11-12 ENCOUNTER — Telehealth: Payer: Self-pay | Admitting: Diagnostic Radiology

## 2014-11-12 NOTE — Telephone Encounter (Signed)
I called the patient's brother, Alden Server, about having Crystal Robinson evaluated for IVC filter removal.  At this time, patient is unable to ambulate and it would be very difficult for the family to transport the patient without an ambulance.  Patient is currently anticoagulated with Xarelto and no obvious problems with the IVC filter.  We discussed the benefits of filter retrieval but problems of transporting the patient at this time.  The patient's situation is also complicated by her mental retardation.  At this time, the family would like to postpone filter removal until the patient is more cooperative and able to ambulate.  Family will contact us if and when the patient is able to travel.

## 2015-01-08 ENCOUNTER — Telehealth: Payer: Self-pay | Admitting: Radiology

## 2015-01-08 NOTE — Telephone Encounter (Signed)
Spoke with Roxy MannsLaura Durley (patient's sister in law).  Patient is still bedridden.  Having physician coming in for home visits for wound care, etc.    Will check back in 3 mos for status update.  Cassara Nida Carmell AustriaGales, RN 01/08/2015 9:42 AM

## 2015-01-31 NOTE — Telephone Encounter (Signed)
deleteHenri Carmell AustriaGales, RN 01/31/2015 10:29 AM

## 2015-03-07 ENCOUNTER — Emergency Department (HOSPITAL_COMMUNITY): Payer: Medicare Other

## 2015-03-07 ENCOUNTER — Encounter (HOSPITAL_COMMUNITY): Payer: Self-pay

## 2015-03-07 ENCOUNTER — Inpatient Hospital Stay (HOSPITAL_COMMUNITY)
Admission: EM | Admit: 2015-03-07 | Discharge: 2015-03-14 | DRG: 602 | Disposition: A | Discharge status: Home Health | Payer: Medicare Other | Attending: Internal Medicine | Admitting: Internal Medicine

## 2015-03-07 DIAGNOSIS — Z8249 Family history of ischemic heart disease and other diseases of the circulatory system: Secondary | ICD-10-CM | POA: Diagnosis not present

## 2015-03-07 DIAGNOSIS — I10 Essential (primary) hypertension: Secondary | ICD-10-CM | POA: Diagnosis present

## 2015-03-07 DIAGNOSIS — Z833 Family history of diabetes mellitus: Secondary | ICD-10-CM | POA: Diagnosis not present

## 2015-03-07 DIAGNOSIS — Z86718 Personal history of other venous thrombosis and embolism: Secondary | ICD-10-CM | POA: Diagnosis not present

## 2015-03-07 DIAGNOSIS — A419 Sepsis, unspecified organism: Secondary | ICD-10-CM

## 2015-03-07 DIAGNOSIS — L03312 Cellulitis of back [any part except buttock]: Secondary | ICD-10-CM | POA: Diagnosis not present

## 2015-03-07 DIAGNOSIS — Z7401 Bed confinement status: Secondary | ICD-10-CM

## 2015-03-07 DIAGNOSIS — E440 Moderate protein-calorie malnutrition: Secondary | ICD-10-CM | POA: Diagnosis present

## 2015-03-07 DIAGNOSIS — I82409 Acute embolism and thrombosis of unspecified deep veins of unspecified lower extremity: Secondary | ICD-10-CM | POA: Diagnosis not present

## 2015-03-07 DIAGNOSIS — E1140 Type 2 diabetes mellitus with diabetic neuropathy, unspecified: Secondary | ICD-10-CM

## 2015-03-07 DIAGNOSIS — F79 Unspecified intellectual disabilities: Secondary | ICD-10-CM

## 2015-03-07 DIAGNOSIS — E119 Type 2 diabetes mellitus without complications: Secondary | ICD-10-CM

## 2015-03-07 DIAGNOSIS — Z66 Do not resuscitate: Secondary | ICD-10-CM | POA: Diagnosis present

## 2015-03-07 DIAGNOSIS — IMO0002 Reserved for concepts with insufficient information to code with codable children: Secondary | ICD-10-CM

## 2015-03-07 DIAGNOSIS — L89109 Pressure ulcer of unspecified part of back, unspecified stage: Secondary | ICD-10-CM | POA: Diagnosis present

## 2015-03-07 DIAGNOSIS — Z79899 Other long term (current) drug therapy: Secondary | ICD-10-CM

## 2015-03-07 DIAGNOSIS — L03317 Cellulitis of buttock: Secondary | ICD-10-CM | POA: Diagnosis not present

## 2015-03-07 DIAGNOSIS — M4628 Osteomyelitis of vertebra, sacral and sacrococcygeal region: Secondary | ICD-10-CM

## 2015-03-07 DIAGNOSIS — Z8052 Family history of malignant neoplasm of bladder: Secondary | ICD-10-CM

## 2015-03-07 DIAGNOSIS — Z7901 Long term (current) use of anticoagulants: Secondary | ICD-10-CM

## 2015-03-07 DIAGNOSIS — Z6822 Body mass index (BMI) 22.0-22.9, adult: Secondary | ICD-10-CM

## 2015-03-07 DIAGNOSIS — M8580 Other specified disorders of bone density and structure, unspecified site: Secondary | ICD-10-CM | POA: Diagnosis present

## 2015-03-07 DIAGNOSIS — E1165 Type 2 diabetes mellitus with hyperglycemia: Secondary | ICD-10-CM | POA: Diagnosis present

## 2015-03-07 DIAGNOSIS — S31000A Unspecified open wound of lower back and pelvis without penetration into retroperitoneum, initial encounter: Secondary | ICD-10-CM

## 2015-03-07 DIAGNOSIS — L89153 Pressure ulcer of sacral region, stage 3: Secondary | ICD-10-CM | POA: Diagnosis present

## 2015-03-07 DIAGNOSIS — K5641 Fecal impaction: Secondary | ICD-10-CM | POA: Diagnosis present

## 2015-03-07 DIAGNOSIS — I82629 Acute embolism and thrombosis of deep veins of unspecified upper extremity: Secondary | ICD-10-CM | POA: Diagnosis not present

## 2015-03-07 DIAGNOSIS — Z794 Long term (current) use of insulin: Secondary | ICD-10-CM

## 2015-03-07 DIAGNOSIS — L0390 Cellulitis, unspecified: Secondary | ICD-10-CM | POA: Diagnosis present

## 2015-03-07 DIAGNOSIS — E785 Hyperlipidemia, unspecified: Secondary | ICD-10-CM | POA: Diagnosis present

## 2015-03-07 DIAGNOSIS — E860 Dehydration: Secondary | ICD-10-CM | POA: Diagnosis present

## 2015-03-07 DIAGNOSIS — L89100 Pressure ulcer of unspecified part of back, unstageable: Secondary | ICD-10-CM

## 2015-03-07 LAB — COMPREHENSIVE METABOLIC PANEL
ALK PHOS: 67 U/L (ref 38–126)
ALT: 10 U/L — AB (ref 14–54)
AST: 15 U/L (ref 15–41)
Albumin: 3.2 g/dL — ABNORMAL LOW (ref 3.5–5.0)
Anion gap: 10 (ref 5–15)
BUN: 13 mg/dL (ref 6–20)
CALCIUM: 10.4 mg/dL — AB (ref 8.9–10.3)
CHLORIDE: 106 mmol/L (ref 101–111)
CO2: 27 mmol/L (ref 22–32)
CREATININE: 0.58 mg/dL (ref 0.44–1.00)
Glucose, Bld: 330 mg/dL — ABNORMAL HIGH (ref 65–99)
Potassium: 3.5 mmol/L (ref 3.5–5.1)
Sodium: 143 mmol/L (ref 135–145)
Total Bilirubin: 0.7 mg/dL (ref 0.3–1.2)
Total Protein: 7.7 g/dL (ref 6.5–8.1)

## 2015-03-07 LAB — URINALYSIS, ROUTINE W REFLEX MICROSCOPIC
Bilirubin Urine: NEGATIVE
Glucose, UA: >1000 mg/dL — AB
HGB URINE DIPSTICK: NEGATIVE
Ketones, ur: NEGATIVE mg/dL
Leukocytes, UA: NEGATIVE
Nitrite: NEGATIVE
PH: 6 (ref 5.0–8.0)
Protein, ur: NEGATIVE mg/dL
SPECIFIC GRAVITY, URINE: 1.032 — AB (ref 1.005–1.030)

## 2015-03-07 LAB — I-STAT CHEM 8, ED
BUN: 13 mg/dL (ref 6–20)
CALCIUM ION: 1.35 mmol/L — AB (ref 1.13–1.30)
CHLORIDE: 104 mmol/L (ref 101–111)
CREATININE: 0.4 mg/dL — AB (ref 0.44–1.00)
Glucose, Bld: 323 mg/dL — ABNORMAL HIGH (ref 65–99)
HEMATOCRIT: 42 % (ref 36.0–46.0)
Hemoglobin: 14.3 g/dL (ref 12.0–15.0)
Potassium: 3.5 mmol/L (ref 3.5–5.1)
SODIUM: 143 mmol/L (ref 135–145)
TCO2: 28 mmol/L (ref 0–100)

## 2015-03-07 LAB — I-STAT CG4 LACTIC ACID, ED
LACTIC ACID, VENOUS: 1.73 mmol/L (ref 0.5–2.0)
LACTIC ACID, VENOUS: 0.96 mmol/L (ref 0.5–2.0)

## 2015-03-07 LAB — CBC WITH DIFFERENTIAL/PLATELET
BASOS PCT: 0 %
Basophils Absolute: 0 10*3/uL (ref 0.0–0.1)
EOS ABS: 0.1 10*3/uL (ref 0.0–0.7)
Eosinophils Relative: 1 %
HEMATOCRIT: 39.1 % (ref 36.0–46.0)
HEMOGLOBIN: 13 g/dL (ref 12.0–15.0)
Lymphocytes Relative: 12 %
Lymphs Abs: 1.5 10*3/uL (ref 0.7–4.0)
MCH: 30 pg (ref 26.0–34.0)
MCHC: 33.2 g/dL (ref 30.0–36.0)
MCV: 90.3 fL (ref 78.0–100.0)
Monocytes Absolute: 1 10*3/uL (ref 0.1–1.0)
Monocytes Relative: 8 %
NEUTROS PCT: 79 %
Neutro Abs: 9.6 10*3/uL — ABNORMAL HIGH (ref 1.7–7.7)
Platelets: 423 10*3/uL — ABNORMAL HIGH (ref 150–400)
RBC: 4.33 MIL/uL (ref 3.87–5.11)
RDW: 12.6 % (ref 11.5–15.5)
WBC: 12.2 10*3/uL — AB (ref 4.0–10.5)

## 2015-03-07 LAB — URINE MICROSCOPIC-ADD ON
Bacteria, UA: NONE SEEN
RBC / HPF: NONE SEEN RBC/hpf (ref 0–5)

## 2015-03-07 LAB — CBG MONITORING, ED
GLUCOSE-CAPILLARY: 253 mg/dL — AB (ref 65–99)
Glucose-Capillary: 328 mg/dL — ABNORMAL HIGH (ref 65–99)

## 2015-03-07 MED ORDER — SODIUM CHLORIDE 0.9 % IV BOLUS (SEPSIS)
2000.0000 mL | Freq: Once | INTRAVENOUS | Status: AC
  Administered 2015-03-07: 2000 mL via INTRAVENOUS

## 2015-03-07 MED ORDER — PIPERACILLIN-TAZOBACTAM 3.375 G IVPB 30 MIN
3.3750 g | Freq: Once | INTRAVENOUS | Status: AC
  Administered 2015-03-07: 3.375 g via INTRAVENOUS
  Filled 2015-03-07: qty 50

## 2015-03-07 MED ORDER — VANCOMYCIN HCL IN DEXTROSE 1-5 GM/200ML-% IV SOLN
1000.0000 mg | Freq: Once | INTRAVENOUS | Status: AC
  Administered 2015-03-07: 1000 mg via INTRAVENOUS
  Filled 2015-03-07: qty 200

## 2015-03-07 NOTE — ED Notes (Signed)
Nurse getting labs and one set of blood cultures

## 2015-03-07 NOTE — ED Provider Notes (Signed)
CSN: [id number]     Arrival date & time 03/07/15  1640 History   First MD Initiated Contact with Patient 03/07/15 1832     Chief Complaint  Patient presents with  . Pressure Ulcers      (Consider location/radiation/quality/duration/timing/severity/associated sxs/prior Treatment) HPI   Pt is a 74 year old female with history of mental retardation, diabetes, hypertension, hyperlipidemia, is taken care of by her daughter at home for the past 17 years, was ambulatory until earlier this year when she had a DVT in January followed by a fractured femur in February, she subsequently was bedbound. Patient reportedly has a ulcer on her low back and buttocks which has been there for some time and managed by her family with some consultation from a wound nurse (reportedly shown pictures, and recently prescribed antibiotics but no face-to-face visits reported).  The ulcer was reportedly healing and was a small 1-2 cm wound until a week ago when a cream was changed.  Family reports rapid worsening of the ulcer over the last week. They also report intermittent fevers this week, and increased pain.  He otherwise reports that the patient is at her baseline mental status and activity level, eating and drinking normally and has not had any vomiting or diarrhea.  The patient was given one dose of Keflex today and otherwise has not had any recent antibiotic treatment. Family at bedside are difficult historians. The primary nurse taking care of the patient reports that she arrived with 3 diapers applied and one small 2 x 2 piece of gauze covering the large necrotic ulcer.     Past Medical History  Diagnosis Date  . Mental retardation   . Diabetes mellitus without complication (HCC)   . Hypertension   . Hypercholesterolemia   . DM (diabetes mellitus), type 2 (HCC) 03/28/2014  . Hyperlipidemia 03/28/2014  . DVT (deep venous thrombosis) [facility](HCC)    Past Surgical History  Procedure Laterality Date  . Abdominal  hysterectomy    . Fracture surgery    . Femur im nail Right 04/18/2014    Procedure: INTRAMEDULLARY  RETROGRADE FEMORAL NAILING;  Surgeon: Budd PalmerMichael H Handy, MD;  Location: [facility] OR;  Service: Orthopedics;  Laterality: Right;   Family History  Problem Relation Age of Onset  . Bladder Cancer Mother   . Hypertension Mother   . Congestive Heart Failure Mother   . Diabetes Mother    Social History  Substance Use Topics  . Smoking status: Never Smoker   . Smokeless tobacco: Never Used  . Alcohol Use: No   OB History    No data available     Review of Systems  Unable to perform ROS: Patient nonverbal  Constitutional: Positive for fever and unexpected weight change (gradual weightloss over the past year). Negative for diaphoresis, activity change, appetite change and fatigue.  Eyes: Negative.   Gastrointestinal: Negative for vomiting, diarrhea and constipation.  Endocrine: Negative.   Skin: Positive for wound.      Allergies  Review of patient's allergies indicates no known allergies.  Home Medications   Prior to Admission medications   Medication Sig Start Date End Date Taking? Authorizing Provider  acetaminophen (TYLENOL) 650 MG CR tablet Take 650 mg by mouth every 8 (eight) hours as needed for pain.   Yes Historical Provider, MD  cephALEXin (KEFLEX) 500 MG capsule Take 500 mg by mouth every 6 (six) hours. ABT Start Date 03/07/15 & End Date 03/16/15.   Yes Historical Provider, MD  Cholecalciferol (VITAMIN D3) 2000 UNITS  TABS Take 1 tablet by mouth daily.   Yes Historical Provider, MD  diphenhydramine-acetaminophen (TYLENOL PM) 25-500 MG TABS tablet Take 1 tablet by mouth at bedtime.   Yes Historical Provider, MD  docusate sodium (COLACE) 100 MG capsule Take 100 mg by mouth at bedtime.   Yes Historical Provider, MD  fenofibrate (TRICOR) 145 MG tablet Take 145 mg by mouth daily.   Yes Historical Provider, MD  Garlic 1000 MG CAPS Take 1,000 mg by mouth daily.   Yes Historical Provider,  MD  miconazole (MICOTIN) 2 % cream Apply 1 application topically daily as needed (wound protection).   Yes Historical Provider, MD  Multiple Vitamins-Minerals (MULTIVITAMIN & MINERAL PO) Take 1 tablet by mouth daily.   Yes Historical Provider, MD  protective barrier (RESTORE) CREA Apply 1 application topically daily as needed (wound protection).   Yes Historical Provider, MD  ramipril (ALTACE) 10 MG capsule Take 1 capsule (10 mg total) by mouth daily. 01/22/12  Yes Bryan R Hess, DO  rivaroxaban (XARELTO) 20 MG TABS tablet Take 1 tablet (20 mg total) by mouth daily with supper. 04/28/14  Yes Rodolph Bong, MD  Sodium Hypochlorite (ANASEPT ANTIMICROBIAL) 0.057 % GEL Apply 1 application topically daily as needed (wound protection).   Yes Historical Provider, MD  vitamin B-12 (CYANOCOBALAMIN) 1000 MCG tablet Take 1,000 mcg by mouth daily.   Yes Historical Provider, MD  vitamin C (ASCORBIC ACID) 500 MG tablet Take 500 mg by mouth daily.   Yes Historical Provider, MD  vitamin E 400 UNIT capsule Take 400 Units by mouth daily.   Yes Historical Provider, MD  acetaminophen (TYLENOL) 325 MG tablet Take 2 tablets (650 mg total) by mouth every 6 (six) hours as needed for mild pain (or Fever >/= 101). Patient not taking: Reported on 03/07/2015 04/20/14   Montez Morita, PA-C  cefUROXime (CEFTIN) 500 MG tablet Take 1 tablet (500 mg total) by mouth 2 (two) times daily with a meal. Patient not taking: Reported on 03/07/2015 04/22/14   Clydia Llano, MD  glipiZIDE (GLUCOTROL XL) 5 MG 24 hr tablet Take 1 tablet (5 mg total) by mouth daily. Patient not taking: Reported on 03/07/2015 01/22/12   Briscoe Deutscher, DO  hydrochlorothiazide (HYDRODIURIL) 25 MG tablet Take 1 tablet (25 mg total) by mouth daily. Patient not taking: Reported on 03/07/2015 01/22/12   Briscoe Deutscher, DO  HYDROcodone-acetaminophen (NORCO/VICODIN) 5-325 MG per tablet Take 1-2 tablets by mouth every 6 (six) hours as needed for moderate pain. Patient not taking:  Reported on 03/07/2015 04/22/14   Clydia Llano, MD  pantoprazole (PROTONIX) 40 MG tablet Take 1 tablet (40 mg total) by mouth daily at 6 (six) AM. Patient not taking: Reported on 04/18/2014 03/31/14   Rodolph Bong, MD  Rivaroxaban (XARELTO STARTER PACK) 15 & 20 MG TBPK Take as directed on package: Start with one 15mg  tablet by mouth twice a day with food. On Day 22, switch to one 20mg  tablet once a day with food. Patient not taking: Reported on 03/07/2015 03/30/14   Rodolph Bong, MD   BP 168/80 mmHg  Pulse 93  Temp(Src) 99.7 F (37.6 C) (Oral)  Resp 18  Ht 5\' 6"  (1.676 m)  Wt 72.576 kg  BMI 25.84 kg/m2  SpO2 96% Physical Exam  Constitutional: She appears well-developed and well-nourished. No distress.  Chronically ill appearing elderly female  HENT:  Head: Normocephalic and atraumatic.  Right Ear: External ear normal.  Left Ear: External ear normal.  Nose:  Nose normal.  Eyes: Conjunctivae are normal. Pupils are equal, round, and reactive to light. Right eye exhibits no discharge. Left eye exhibits no discharge. No scleral icterus.  Neck: Normal range of motion. Neck supple. No tracheal deviation present.  Cardiovascular: Normal rate, regular rhythm and normal heart sounds.  Exam reveals no gallop and no friction rub.   No murmur heard. Pulmonary/Chest: Effort normal and breath sounds normal. No accessory muscle usage. No tachypnea. No respiratory distress.  Abdominal: Soft. Normal appearance and bowel sounds are normal. She exhibits no distension. There is no tenderness. There is no rebound and no guarding.  Musculoskeletal: Normal range of motion. She exhibits no edema.  Neurological: She is alert. She exhibits normal muscle tone. Coordination normal. GCS eye subscore is 4. GCS verbal subscore is 2. GCS motor subscore is 5.  Responds to pain, atrophy in all extremities, moves all extremities  Skin: Skin is warm and dry. Lesion noted. No rash noted. She is not diaphoretic. There  is erythema. There is pallor.  Pale skin on torso and extremities, flushed pink cheeks Large Left sacral region ulceration with necrotic tissue, appears full thickness with tunneling, malodorous with clear drainage, surrounding erythematous tissue, with white powder applied to surrounding area - see picture  Psychiatric: She is noncommunicative.  Nursing note and vitals reviewed.       ED Course  Procedures (including critical care time) Labs Review Labs Reviewed  COMPREHENSIVE METABOLIC PANEL - Abnormal; Notable for the following:    Glucose, Bld 330 (*)    Calcium 10.4 (*)    Albumin 3.2 (*)    ALT 10 (*)    All other components within normal limits  CBC WITH DIFFERENTIAL/PLATELET - Abnormal; Notable for the following:    WBC 12.2 (*)    Platelets 423 (*)    Neutro Abs 9.6 (*)    All other components within normal limits  URINALYSIS, ROUTINE W REFLEX MICROSCOPIC (NOT AT [facility]) - Abnormal; Notable for the following:    APPearance CLOUDY (*)    Specific Gravity, Urine 1.032 (*)    Glucose, UA >1000 (*)    All other components within normal limits  URINE MICROSCOPIC-ADD ON - Abnormal; Notable for the following:    Squamous Epithelial / LPF 0-5 (*)    All other components within normal limits  CBG MONITORING, ED - Abnormal; Notable for the following:    Glucose-Capillary 328 (*)    All other components within normal limits  I-STAT CHEM 8, ED - Abnormal; Notable for the following:    Creatinine, Ser 0.40 (*)    Glucose, Bld 323 (*)    Calcium, Ion 1.35 (*)    All other components within normal limits  CBG MONITORING, ED - Abnormal; Notable for the following:    Glucose-Capillary 253 (*)    All other components within normal limits  CULTURE, BLOOD (ROUTINE X 2)  CULTURE, BLOOD (ROUTINE X 2)  URINE CULTURE  I-STAT CG4 LACTIC ACID, ED  I-STAT CG4 LACTIC ACID, ED    Imaging Review Dg Lumbar Spine 2-3 Views  03/07/2015  CLINICAL DATA:  74 year old female with pressure  ulcer in the lower back. Rule out osteomyelitis. EXAM: LUMBAR SPINE - 2-3 VIEW; DG HIP (WITH OR WITHOUT PELVIS) 2V BILAT COMPARISON:  Radiograph dated 04/19/2014 FINDINGS: There is osteopenia with multilevel diffuse degenerative changes of the spine. There is chronic appearing compression of the L5 vertebra. There is grade 1 L4-5 anterolisthesis. No acute fracture or dislocation. Right femoral  intra medullary hardware. Constipation.  An IVC filter is noted. IMPRESSION: Osteopenia with multilevel degenerative changes of the spine. No acute fracture or dislocation. MRI or a white blood cell nuclear scan may provide better evaluation if osteomyelitis is clinically suspected. Electronically Signed   By: Elgie Collard M.D.   On: 03/07/2015 21:28   Dg Chest Port 1 View  03/07/2015  CLINICAL DATA:  Pt, from home, presents for sacral/buttock pressure ulcer wound check. EXAM: PORTABLE CHEST 1 VIEW COMPARISON:  04/18/2014 FINDINGS: Cardiac silhouette normal in size and configuration. No mediastinal or hilar masses or convincing adenopathy. Clear lungs. No pleural effusion or pneumothorax. Bony thorax is demineralized but grossly intact. IMPRESSION: No active disease. Electronically Signed   By: Amie Portland M.D.   On: 03/07/2015 20:00   Dg Hips Bilat With Pelvis 2v  03/07/2015  CLINICAL DATA:  74 year old female with pressure ulcer in the lower back. Rule out osteomyelitis. EXAM: LUMBAR SPINE - 2-3 VIEW; DG HIP (WITH OR WITHOUT PELVIS) 2V BILAT COMPARISON:  Radiograph dated 04/19/2014 FINDINGS: There is osteopenia with multilevel diffuse degenerative changes of the spine. There is chronic appearing compression of the L5 vertebra. There is grade 1 L4-5 anterolisthesis. No acute fracture or dislocation. Right femoral intra medullary hardware. Constipation.  An IVC filter is noted. IMPRESSION: Osteopenia with multilevel degenerative changes of the spine. No acute fracture or dislocation. MRI or a white blood cell  nuclear scan may provide better evaluation if osteomyelitis is clinically suspected. Electronically Signed   By: Elgie Collard M.D.   On: 03/07/2015 21:28   I have personally reviewed and evaluated these images and lab results as part of my medical decision-making.   EKG Interpretation   Date/Time:  Thursday March 07 2015 19:40:02 EST Ventricular Rate:  86 PR Interval:  157 QRS Duration: 94 QT Interval:  399 QTC Calculation: 477 R Axis:   69 Text Interpretation:  Sinus rhythm Borderline T abnormalities, anterior  leads since last tracing no significant change Confirmed by WENTZ  MD,  ELLIOTT [phone]) on 03/07/2015 7:44:53 PM      MDM   Large pressure ulcer with necrosis/cellulitis, tunneling to deeper tissues History is limited by pt MR, and her caretakers are difficult historians. She appears to have been bedbound most of this year s/p DVT in Jan. 2016 and femur fx Feb. 2016.  They state her low back pressure ulcers had recently been improving until partially one week ago.  Over the past 4 days patient has been febrile with severe worsening of her wound.  There is reported home health nurse, wound care nurse and physical therapy assistance in their home, however it does not sound like a wound care nurse has personally evaluated the patient in a face-to-face visit. Family states the wound care nurse was "shown a picture" and called in antibiotics today, patient has only received 1 dose of Keflex.  Patient was worked up for sepsis with concern for osteomyelitis.  Ordered 2 L IVF (may need 30 mL/kg, weight pending), Vanc/Zosyn ordered.  She is hyperglycemic. She had leukocytosis of 12.2  lactic acid was 0.96.   temp 99.7, heart rate in the 90s, normal respiratory rate, pt initially hypertensive.  Dr. Effie Shy has personally seen and evaluated the patient and is in agreement with the workup and plan.  Patient will be need to be admitted for IV antibiotics and further workup. X-rays  were obtained however MRI will need to be utilized, likely with planned sedation, to definitively evaluate for  osteomyelitis.  Dr. Maryfrances Bunnell to admit to telemetry for further workup and treatment.  Final diagnoses:  Pressure ulcer of back, unspecified pressure ulcer stage  Sepsis, due to unspecified organism [facility])        Danelle Berry, PA-C 03/08/15 1610  Mancel Bale, MD 03/08/15 [phone]

## 2015-03-07 NOTE — ED Notes (Signed)
MD at bedside. 

## 2015-03-07 NOTE — ED Provider Notes (Signed)
  Face-to-face evaluation   History: She is here for evaluation of a pressure sore which has worsened despite treatment at her home.  Her PCP has called in a prescription for Keflex, today. It appears that she is getting in-home evaluations by the PCP.  Physical exam: Alert, elderly female who is a poor historian, with mental retardation. Left sacral region with large ulceration, containing necrotic tissue, which is malodorous, and mild drainage.  Medical screening examination/treatment/procedure(s) were conducted as a shared visit with non-physician practitioner(s) and myself.  I personally evaluated the patient during the encounter  Mancel BaleElliott Karlee Staff, MD 03/08/15 2344

## 2015-03-07 NOTE — ED Notes (Signed)
exray now in room

## 2015-03-07 NOTE — H&P (Signed)
History and Physical  Patient Name: Crystal Robinson     QMV:[id number]    DOB: Jul 09, 1940    DOA: 03/07/2015 Referring physician: Danelle Berry, PA-C PCP: Marletta Lor, NP      Chief Complaint: Wound odor  HPI: Crystal Robinson is a 74 y.o. 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  All history is collected from the chart and from family who are present at the bedside, as the patient is nonverbal at baseline. The patient had a DVT followed by a femoral fracture last February and March which has resulted in her being bedbound for the last year. During that time she's developed sacral ulcers which wax and wane in their severity.   Now family report that in the last 2 weeks the patient's largest sacral ulcer has gone "downhill in a hurry". Initially there was dead tissue that appeared around the edges of the ulcer. The family's home care wound nurse recommended a change in dressings and topical ointments, after which it became more purulent discharge, a "pus pocket"?, worsening redness around the wound, and low-grade fevers.  Yesterday, someone (the family is not clear who) prescribed Keflex for wound infection. Also in the last few days, a family member who is a physical therapist in [place] asked for an informal second opinion from one of her mentors based on a picture of the wound, and that person recommended family bring her to the ER.  In the ED, the patient had a low-grade fever, tachycardia, tachypnea, and leukocytosis.  An image of the wound is available in the note of Danelle Berry.  Plain radiographs showed no definite evidence of osteomyelitis.  The lactic acid level was normal, and blood and urine cultures were drawn, and the patient was administered 2L NS and vancomycin/piperacillin-tazobactam.  TRH were asked to admit for worsening sacral decubitus ulcer with cellulitis and early systemic  infection.     Review of Systems:  Unable to obtain due to patient mentation.  No Known Allergies  Prior to Admission medications   Medication Sig Start Date End Date Taking? Authorizing Provider  acetaminophen (TYLENOL) 650 MG CR tablet Take 650 mg by mouth every 8 (eight) hours as needed for pain.   Yes Historical Provider, MD  Cholecalciferol (VITAMIN D3) 2000 UNITS TABS Take 1 tablet by mouth daily.   Yes Historical Provider, MD  diphenhydramine-acetaminophen (TYLENOL PM) 25-500 MG TABS tablet Take 1 tablet by mouth at bedtime.   Yes Historical Provider, MD  docusate sodium (COLACE) 100 MG capsule Take 100 mg by mouth at bedtime.   Yes Historical Provider, MD  fenofibrate (TRICOR) 145 MG tablet Take 145 mg by mouth daily.   Yes Historical Provider, MD  Garlic 1000 MG CAPS Take 1,000 mg by mouth daily.   Yes Historical Provider, MD  miconazole (MICOTIN) 2 % cream Apply 1 application topically daily as needed (wound protection).   Yes Historical Provider, MD  Multiple Vitamins-Minerals (MULTIVITAMIN & MINERAL PO) Take 1 tablet by mouth daily.   Yes Historical Provider, MD  protective barrier (RESTORE) CREA Apply 1 application topically daily as needed (wound protection).   Yes Historical Provider, MD  ramipril (ALTACE) 10 MG capsule Take 1 capsule (10 mg total) by mouth daily. 01/22/12  Yes Bryan R Hess, DO  rivaroxaban (XARELTO) 20 MG TABS tablet Take 1 tablet (20 mg total) by mouth daily with supper. 04/28/14  Yes Ramiro Harvest  V, MD  Sodium Hypochlorite (ANASEPT ANTIMICROBIAL) 0.057 % GEL Apply 1 application topically daily as needed (wound protection).   Yes Historical Provider, MD  vitamin B-12 (CYANOCOBALAMIN) 1000 MCG tablet Take 1,000 mcg by mouth daily.   Yes Historical Provider, MD  vitamin C (ASCORBIC ACID) 500 MG tablet Take 500 mg by mouth daily.   Yes Historical Provider, MD  vitamin E 400 UNIT capsule Take 400 Units by mouth daily.   Yes Historical Provider, MD  glipiZIDE  (GLUCOTROL XL) 5 MG 24 hr tablet Take 1 tablet (5 mg total) by mouth daily. Patient not taking: Reported on 03/07/2015 01/22/12   Briscoe DeutscherBryan R Hess, DO    Past Medical History  Diagnosis Date  . Mental retardation   . Diabetes mellitus without complication (HCC)   . Hypertension   . Hypercholesterolemia   . DM (diabetes mellitus), type 2 (HCC) 03/28/2014  . Hyperlipidemia 03/28/2014  . DVT (deep venous thrombosis) [facility](HCC)     Past Surgical History  Procedure Laterality Date  . Abdominal hysterectomy    . Fracture surgery    . Femur im nail Right 04/18/2014    Procedure: INTRAMEDULLARY  RETROGRADE FEMORAL NAILING;  Surgeon: Budd PalmerMichael H Handy, MD;  Location: [facility] OR;  Service: Orthopedics;  Laterality: Right;    Family history: family history includes Bladder Cancer in her mother; Congestive Heart Failure in her mother; Diabetes in her mother; Hypertension in her mother.  Social History: Patient lives with her brother and sister-in-law since 15 years ago. She is non-ambulatory for the last 9 months.  She is nonverbal at baseline.  Her brother is HCPOA.    Physical Exam: BP 168/80 mmHg  Pulse 93  Temp(Src) 99.7 F (37.6 C) (Oral)  Resp 18  Ht 5\' 6"  (1.676 m)  Wt 72.576 kg (160 lb)  BMI 25.84 kg/m2  SpO2 96% General appearance: Debilitated elderly female, non-verbal.   Eyes: Anicteric, no discharge.     ENT: No nasal deformity, discharge, or epistaxis.  OP tacky, no suspicious lesions.   Skin: Warm and moist and flushed. On the sacrum, there is a very foul smelling, deep ulcer with purulent and bloody drainage, as well as >3cm surrounding erythema. Cardiac: Tachycardic, regular, nl S1-S2, no murmurs appreciated.  Capillary refill is brisk.  Respiratory: CTAb but exam limited by poor inspiratory effort. Abdomen: Abdomen soft without rigidity.  No TTP elicited. No ascites, distension.   Neuro: Non-verbal.  Contractures and tremors.  At baseline per family. Psych: Unable to  assess.    Labs on Admission:  The metabolic panel shows normal sodium, potassium, bicarbonate, and renal function. Hyperuricemia. The transaminases and bilirubin are normal. His mild hypercalcemia Lactic acid level is normal. Urine and blood cultures are pending. The urinalysis is bland and without pyuria. The complete blood count shows leukocytosis.   Radiological Exams on Admission: Personally reviewed: Dg Chest Port 1 View 03/07/2015  Clear   Dg Lumbar Spine 2-3 Views 03/07/2015 IMPRESSION: Osteopenia with multilevel degenerative changes of the spine. No acute fracture or dislocation. MRI or a white blood cell nuclear scan may provide better evaluation if osteomyelitis is clinically suspected. Electronically Signed   By: Elgie CollardArash  Radparvar M.D.   On: 03/07/2015 21:28    Dg Hips Bilat With Pelvis 2v 03/07/2015   IMPRESSION: Osteopenia with multilevel degenerative changes of the spine. No acute fracture or dislocation. MRI or a white blood cell nuclear scan may provide better evaluation if osteomyelitis is clinically suspected. Electronically Signed   By:  Elgie Collard M.D.   On: 03/07/2015 21:28    EKG: Independently reviewed. Sinus with rate 86 bpm.  No ST changes.    Assessment/Plan 1. Cellulitis of sacral decubitus ulcer:  This is new.  The patient appears to have early systemic infection which is low grade and not from urinary or pneumonia source.  The wound is worsening over the past two weeks, along with fevers.  There is a question of bacteremia or osteomyelitis. Blood cultures are pending. Radiographs are non-diagnostic, but an MRI with contrast to evaluate for osteomyelitis or abscess is ordered. At the time of my evaluation, the patient does not have signs of hemodynamic compromise or end-organ dysfunction and is stable for a medical-surgical bed.   -Vancomycin and Zosyn, renally dosed -MR pelvis with contrast -Wound care consult -Follow blood culture   2.  Hypercalcemia and dehydration:  This is new.   -Fluids administered -Trend calcium  3. NIDDM with hyperglycemia:  -Restart glipizide -Sliding scale insulin corrections  4. History of DVT:  -Continue rivaroxaban  5. Hypertension and cholesterol:  Stable.  -Continue ramipril and fenofibrate      DVT PPx: Rivaroxaban Diet: Regular Consultants: Wound Code Status: DO NOT RESUSCITATE Family Communication: The patient's workup including blood cultures and MRI were discussed with the family (brother and sister-in-law) at the bedside. All questions were answered. CODE STATUS was confirmed.  Medical decision making: What exists of the patient's previous chart  was reviewed in depth and the case was discussed with Danelle Berry, PA-C. Patient seen 10:29 PM on 03/07/2015.  Disposition Plan:  Admit for IV antibiotics, MRI and following blood cultures.  Anticipate 3-4 days admission.      Alberteen Sam [facility] Pager [phone]

## 2015-03-07 NOTE — ED Notes (Signed)
Bed: [id number] Expected date:  Expected time:  Means of arrival:  Comments: Hall C- EKG

## 2015-03-07 NOTE — ED Notes (Signed)
EKG given to EDP,Wentz,MD., for review. 

## 2015-03-07 NOTE — ED Notes (Signed)
ATTEMPT X 2 FOR IV NOT SUCCESSFUL. REQUESTED ALAINA RN TO ATTEMPT

## 2015-03-07 NOTE — ED Notes (Signed)
ED PA at bedside

## 2015-03-07 NOTE — ED Notes (Signed)
Tech getting ekg,  I;m waiting to get 2nd set of blood cultures

## 2015-03-07 NOTE — ED Notes (Addendum)
Per PTAR, Pt, from home, presents for sacral/buttock pressure ulcer wound check.  PTAR was informed that the Pt was prescribed Keflex by Home Health RN today and has had one dose.  However, family is concerned that the wounds are increasing and need further attention.  Hx of DM and MR.

## 2015-03-08 ENCOUNTER — Inpatient Hospital Stay (HOSPITAL_COMMUNITY): Payer: Medicare Other

## 2015-03-08 ENCOUNTER — Encounter (HOSPITAL_COMMUNITY): Payer: Self-pay

## 2015-03-08 DIAGNOSIS — I82629 Acute embolism and thrombosis of deep veins of unspecified upper extremity: Secondary | ICD-10-CM

## 2015-03-08 DIAGNOSIS — L0390 Cellulitis, unspecified: Secondary | ICD-10-CM

## 2015-03-08 LAB — GLUCOSE, CAPILLARY
GLUCOSE-CAPILLARY: 277 mg/dL — AB (ref 65–99)
GLUCOSE-CAPILLARY: 193 mg/dL — AB (ref 65–99)
GLUCOSE-CAPILLARY: 278 mg/dL — AB (ref 65–99)
Glucose-Capillary: 278 mg/dL — ABNORMAL HIGH (ref 65–99)
Glucose-Capillary: 234 mg/dL — ABNORMAL HIGH (ref 65–99)

## 2015-03-08 LAB — BASIC METABOLIC PANEL
Anion gap: 7 (ref 5–15)
BUN: 10 mg/dL (ref 6–20)
CHLORIDE: 110 mmol/L (ref 101–111)
CO2: 28 mmol/L (ref 22–32)
CREATININE: 0.63 mg/dL (ref 0.44–1.00)
Calcium: 10.1 mg/dL (ref 8.9–10.3)
Glucose, Bld: 278 mg/dL — ABNORMAL HIGH (ref 65–99)
POTASSIUM: 3.5 mmol/L (ref 3.5–5.1)
SODIUM: 145 mmol/L (ref 135–145)

## 2015-03-08 LAB — CBC
HEMATOCRIT: 32.9 % — AB (ref 36.0–46.0)
Hemoglobin: 10.9 g/dL — ABNORMAL LOW (ref 12.0–15.0)
MCH: 29.5 pg (ref 26.0–34.0)
MCHC: 33.1 g/dL (ref 30.0–36.0)
MCV: 88.9 fL (ref 78.0–100.0)
PLATELETS: 382 10*3/uL (ref 150–400)
RBC: 3.7 MIL/uL — AB (ref 3.87–5.11)
RDW: 12.5 % (ref 11.5–15.5)
WBC: 10.9 10*3/uL — AB (ref 4.0–10.5)

## 2015-03-08 MED ORDER — PREMIER PROTEIN SHAKE
11.0000 [oz_av] | Freq: Every day | ORAL | Status: DC
  Administered 2015-03-08 – 2015-03-11 (×4): 11 [oz_av] via ORAL
  Filled 2015-03-08 (×4): qty 325.31

## 2015-03-08 MED ORDER — ENSURE ENLIVE PO LIQD: 237.0000 mL | Freq: Two times a day (BID) | ORAL | Status: DC

## 2015-03-08 MED ORDER — LORAZEPAM 2 MG/ML IJ SOLN
0.5000 mg | Freq: Once | INTRAMUSCULAR | Status: AC | PRN
Start: 2015-03-08 — End: 2015-03-08
  Administered 2015-03-08: 0.5 mg via INTRAVENOUS
  Filled 2015-03-08: qty 1

## 2015-03-08 MED ORDER — FENOFIBRATE 160 MG PO TABS
160.0000 mg | ORAL_TABLET | Freq: Every day | ORAL | Status: DC
  Administered 2015-03-08 – 2015-03-14 (×7): 160 mg via ORAL
  Filled 2015-03-08 (×7): qty 1

## 2015-03-08 MED ORDER — INSULIN ASPART 100 UNIT/ML ~~LOC~~ SOLN
0.0000 [IU] | Freq: Three times a day (TID) | SUBCUTANEOUS | Status: DC
  Administered 2015-03-08 (×2): 2 [IU] via SUBCUTANEOUS
  Administered 2015-03-08: 5 [IU] via SUBCUTANEOUS
  Administered 2015-03-09 (×2): 1 [IU] via SUBCUTANEOUS
  Administered 2015-03-10: 3 [IU] via SUBCUTANEOUS
  Administered 2015-03-11: 2 [IU] via SUBCUTANEOUS
  Administered 2015-03-11: 1 [IU] via SUBCUTANEOUS
  Administered 2015-03-12: 5 [IU] via SUBCUTANEOUS
  Administered 2015-03-12 – 2015-03-14 (×3): 1 [IU] via SUBCUTANEOUS

## 2015-03-08 MED ORDER — PIPERACILLIN-TAZOBACTAM 3.375 G IVPB
3.3750 g | Freq: Three times a day (TID) | INTRAVENOUS | Status: DC
  Administered 2015-03-08 – 2015-03-13 (×16): 3.375 g via INTRAVENOUS
  Filled 2015-03-08 (×14): qty 50

## 2015-03-08 MED ORDER — GLIPIZIDE ER 5 MG PO TB24
5.0000 mg | ORAL_TABLET | Freq: Every day | ORAL | Status: DC
  Administered 2015-03-08 – 2015-03-14 (×7): 5 mg via ORAL
  Filled 2015-03-08 (×7): qty 1

## 2015-03-08 MED ORDER — COLLAGENASE 250 UNIT/GM EX OINT
TOPICAL_OINTMENT | Freq: Every day | CUTANEOUS | Status: DC
  Administered 2015-03-08 – 2015-03-12 (×5): via TOPICAL
  Filled 2015-03-08 (×2): qty 30

## 2015-03-08 MED ORDER — VANCOMYCIN HCL 500 MG IV SOLR
500.0000 mg | Freq: Two times a day (BID) | INTRAVENOUS | Status: DC
  Administered 2015-03-08 – 2015-03-10 (×6): 500 mg via INTRAVENOUS
  Filled 2015-03-08 (×7): qty 500

## 2015-03-08 MED ORDER — ONDANSETRON HCL 4 MG PO TABS
4.0000 mg | ORAL_TABLET | Freq: Four times a day (QID) | ORAL | Status: DC | PRN
  Administered 2015-03-12: 4 mg via ORAL
  Filled 2015-03-08: qty 1

## 2015-03-08 MED ORDER — RAMIPRIL 10 MG PO CAPS
10.0000 mg | ORAL_CAPSULE | Freq: Every day | ORAL | Status: DC
  Administered 2015-03-08 – 2015-03-14 (×7): 10 mg via ORAL
  Filled 2015-03-08 (×7): qty 1

## 2015-03-08 MED ORDER — RIVAROXABAN 20 MG PO TABS
20.0000 mg | ORAL_TABLET | Freq: Every day | ORAL | Status: DC
  Administered 2015-03-08 (×2): 20 mg via ORAL
  Filled 2015-03-08 (×2): qty 1

## 2015-03-08 MED ORDER — ONDANSETRON HCL 4 MG/2ML IJ SOLN: 4.0000 mg | Freq: Four times a day (QID) | INTRAMUSCULAR | Status: DC | PRN

## 2015-03-08 MED ORDER — SENNOSIDES-DOCUSATE SODIUM 8.6-50 MG PO TABS
1.0000 | ORAL_TABLET | Freq: Every evening | ORAL | Status: DC | PRN
  Administered 2015-03-08: 1 via ORAL
  Filled 2015-03-08: qty 1

## 2015-03-08 MED ORDER — RIVAROXABAN 20 MG PO TABS: 20.0000 mg | ORAL_TABLET | Freq: Every day | ORAL | Status: DC

## 2015-03-08 MED ORDER — INSULIN ASPART 100 UNIT/ML ~~LOC~~ SOLN
0.0000 [IU] | Freq: Every day | SUBCUTANEOUS | Status: DC
  Administered 2015-03-08 (×2): 3 [IU] via SUBCUTANEOUS
  Administered 2015-03-09 – 2015-03-10 (×2): 2 [IU] via SUBCUTANEOUS

## 2015-03-08 MED ORDER — ACETAMINOPHEN 325 MG PO TABS
650.0000 mg | ORAL_TABLET | Freq: Four times a day (QID) | ORAL | Status: DC | PRN
  Administered 2015-03-08 – 2015-03-12 (×6): 650 mg via ORAL
  Filled 2015-03-08 (×6): qty 2

## 2015-03-08 NOTE — Progress Notes (Signed)
Initial Nutrition Assessment  DOCUMENTATION CODES:   Not applicable  INTERVENTION:  - Will order Premier Protein once/day, this supplement provides 130 kcal and 30 grams of protein. - Will d/c Ensure Enlive at this time - RD will continue to monitor for needs  NUTRITION DIAGNOSIS:   Increased nutrient needs related to wound healing as evidenced by estimated needs.  GOAL:   Patient will meet greater than or equal to 90% of their needs  MONITOR:   PO intake, Supplement acceptance, Weight trends, Labs, Skin, I & O's  REASON FOR ASSESSMENT:   Malnutrition Screening Tool  ASSESSMENT:   74 y.o. 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  Pt seen for MST. BMI indicates normal weight. Pt is nonverbal mainly and is cared for by her brother and sister-in-law, who were in the room at time of RD visit; pt was out of the room to MRI. All information provided by these family members.  PTA and currently pt had/has a very good appetite. Chart review indicates she ate 80% of breakfast this AM. Pt had previously been able to feed herself but for the past 1 year family has fed her to do changes in medical status. Pt is able to chew and swallow well as long as foods are cut into small pieces and are tender; she does not have any teeth and is not a candidate for dentures. Family has been providing pt with high protein foods to assist with wound healing. They also provided her with Premier Protein shake once/day or BID, dependent on protein intake with meals, and would like pt to receive this supplement between lunch and dinner each day during hospitalization.   Family states that several different treatments have been occuring for wound/wound healing but most recent treatment made wound worse and the decision was made to bring pt to the hospital. No wound care notes are available but RD will  continue to monitor for documentation concerning wound and determine if estimated needs need adjustment.   Unable to perform physical assessment at this time. Family reports that despite good appetite PTA, pt lost 30-40 lbs over the past 1 year due to thyroid issues. Chart review indicates pt lost 39 lbs (22% body weight) in the past 11 months which is significant for time frame. Unable to state malnutrition at this time due to inability to perform physical assessment.   Pt likely to meet needs with meals and supplement but will continue to monitor and adjust as needed. Medications reviewed. Labs reviewed; CBGs: 253-328 mg/dL.   Diet Order:  Diet regular Room service appropriate?: Yes; Fluid consistency:: Thin  Skin:  Wound (see comment) (DM ulcer to coccyx)  Last BM:  12/23  Height:   Ht Readings from Last 1 Encounters:  03/08/15 5\' 6"  (1.676 m)    Weight:   Wt Readings from Last 1 Encounters:  03/08/15 139 lb 1.8 oz (63.1 kg)    Ideal Body Weight:  59.09 kg (kg)  BMI:  Body mass index is 22.46 kg/(m^2).  Estimated Nutritional Needs:   Kcal:  1600-1800  Protein:  75-85 grams  Fluid:  2.2 L/day  EDUCATION NEEDS:   No education needs identified at this time     Trenton GammonJessica Somalia Segler, RD, LDN Inpatient Clinical Dietitian Pager # [phone] After hours/weekend pager # [phone]

## 2015-03-08 NOTE — Care Management Note (Signed)
Case Management Note  Patient Details  Name: Crystal Robinson MRN: [id number] Date of Birth: 1940-10-03  Subjective/Objective:74 y/o f admitted w/sacral decub. Hx:MR, bedbound.Active w/[facility] HHRN.  Please put in HHRN,[id number] resumption orders if needed.                  Action/Plan:d/c plan home.   Expected Discharge Date:                  Expected Discharge Plan:  Home w Home Health Services  In-House Referral:     Discharge planning Services  CM Consult  Post Acute Care Choice:  Home Health (Active w/[facility]-HHRN) Choice offered to:     DME Arranged:    DME Agency:     HH Arranged:    HH Agency:     Status of Service:  In process, will continue to follow  Medicare Important Message Given:    Date Medicare IM Given:    Medicare IM give by:    Date Additional Medicare IM Given:    Additional Medicare Important Message give by:     If discussed at Long Length of Stay Meetings, dates discussed:    Additional Comments:  Lanier ClamMahabir, Melady Chow, RN 03/08/2015, 2:16 PM

## 2015-03-08 NOTE — Consult Note (Signed)
WOC wound consult note Reason for Consult:  Sacral ulcer MRI: IMPRESSION: 1. Sacral decubitus ulcer with surrounding edema without evidence of osteomyelitis. 5 x 2 x 5 cm fluid collection along the left inferolateral aspect of the sacrum concerning for abscess. Wound type: Stage 4 pressure injury; sacrum-6cm x 6.5cm x 2cm x undermining 3 -7 o'clock  Stage 3 pressure injury; distal to sacral ulcer 0.5cm x 0.1cm x 0.1cm  Scattered MASD (moisture associated skin damage) Pressure Ulcer POA: Yes Measurement:see above Wound bed: Sacrum: 30% necrotic/70% pink/pale Just distal to this area; pink, moist Drainage (amount, consistency, odor) moderate, not purulent, serosanguinous  Periwound: red, blanches but appears to be MASD, wears incontinence brief at home but appears well taken care of. Dressing procedure/placement/frequency: Will add enzymatic debridement ointment daily.  Contacted hospitalist to consider surgical evaluation based on findings on the MRI.  Add air mattress overlay for pressure redistribution.  Protective barrier cream on the superficial areas around the sacral ulcer and to protect fragile skin from moisture.   Discussed POC with patient's family and bedside nurse.  Re consult if needed, will not follow at this time. Thanks  Siraj Dermody [employer] RN, CWOCN [phone])

## 2015-03-08 NOTE — Progress Notes (Signed)
[facility] PROGRESS NOTE  Crystal Robinson [id number] DOB: July 02, 1940 DOA: 03/07/2015 PCP: Marletta LorBarr, Julie, NP  Assessment/Plan: Principal Problem:   Cellulitis - continue antibiotic therapy - wound care nurse on board - consult general surgery for evaluation from their standpoint of Sacral ulcer next am  Active Problems:   Mental retardation   Essential hypertension   Controlled type 2 diabetes mellitus without complication, without long-term current use of insulin (HCC) - Currently not well controlled will continue glipizide and sliding scale insulin. If blood sugars not improved then we'll plan on increasing sliding scale insulin regimen - Blood sugars elevated most likely elevated secondary to active infection   DVT (deep venous thrombosis) (HCC) - Patient on xarelto   Pressure ulcer of back - Consult general surgery next a.m.   Hypercalcemia - Resolved with IV fluid rehydration   Protein-calorie malnutrition, moderate (HCC) - Continue nutritional supplementation  Code Status: DO NOT RESUSCITATE Family Communication: Discussed with family Disposition Plan: Pending improvement in condition   Consultants:  None  Procedures:  None  Antibiotics:  Zosyn and Vancomycin  HPI/Subjective: Pt is non verbal, discussed case with family  Objective: Filed Vitals:   03/07/15 2330 03/08/15 0119  BP: 144/74 143/74  Pulse: 92 73  Temp:  98.1 F (36.7 C)  Resp: 19 18    Intake/Output Summary (Last 24 hours) at 03/08/15 1801 Last data filed at 03/08/15 1723  Gross per 24 hour  Intake    960 ml  Output      0 ml  Net    960 ml   Filed Weights   03/07/15 2134 03/08/15 0119  Weight: 72.576 kg (160 lb) 63.1 kg (139 lb 1.8 oz)    Exam:   General:  Pt in nad, alert and awake  Cardiovascular: rrr, no mrg  Respiratory: cta bl, no wheezes  Abdomen: soft, nd, nt  Musculoskeletal: no cyanosis on limited exam at upper extremities   Data Reviewed: Basic  Metabolic Panel:  Recent Labs Lab 03/07/15 1944 03/07/15 1949 03/08/15 0443  NA 143 143 145  K 3.5 3.5 3.5  CL 104 106 110  CO2  --  27 28  GLUCOSE 323* 330* 278*  BUN 13 13 10   CREATININE 0.40* 0.58 0.63  CALCIUM  --  10.4* 10.1   Liver Function Tests:  Recent Labs Lab 03/07/15 1949  AST 15  ALT 10*  ALKPHOS 67  BILITOT 0.7  PROT 7.7  ALBUMIN 3.2*   No results for input(s): LIPASE, AMYLASE in the last 168 hours. No results for input(s): AMMONIA in the last 168 hours. CBC:  Recent Labs Lab 03/07/15 1944 03/07/15 1949 03/08/15 0443  WBC  --  12.2* 10.9*  NEUTROABS  --  9.6*  --   HGB 14.3 13.0 10.9*  HCT 42.0 39.1 32.9*  MCV  --  90.3 88.9  PLT  --  423* 382   Cardiac Enzymes: No results for input(s): CKTOTAL, CKMB, CKMBINDEX, TROPONINI in the last 168 hours. BNP (last 3 results) No results for input(s): BNP in the last 8760 hours.  ProBNP (last 3 results) No results for input(s): PROBNP in the last 8760 hours.  CBG:  Recent Labs Lab 03/07/15 2131 03/08/15 0150 03/08/15 0801 03/08/15 1210 03/08/15 1732  GLUCAP 253* 278* 277* 193* 234*    Recent Results (from the past 240 hour(s))  Blood Culture (routine x 2)     Status: None (Preliminary result)   Collection Time: 03/07/15  7:25 PM  Result Value  Ref Range Status   Specimen Description BLOOD RIGHT WRIST  Final   Special Requests BOTTLES DRAWN AEROBIC AND ANAEROBIC 5CC EACH  Final   Culture   Final    NO GROWTH < 24 HOURS Performed at [facility]    Report Status PENDING  Incomplete  Blood Culture (routine x 2)     Status: None (Preliminary result)   Collection Time: 03/07/15  8:05 PM  Result Value Ref Range Status   Specimen Description BLOOD LEFT ANTECUBITAL  Final   Special Requests BOTTLES DRAWN AEROBIC AND ANAEROBIC 5CC EACH  Final   Culture   Final    NO GROWTH < 24 HOURS Performed at [facility]    Report Status PENDING  Incomplete  Urine culture     Status: None  (Preliminary result)   Collection Time: 03/07/15  8:37 PM  Result Value Ref Range Status   Specimen Description URINE, CATHETERIZED  Final   Special Requests NONE  Final   Culture   Final    NO GROWTH < 24 HOURS Performed at [facility]    Report Status PENDING  Incomplete     Studies: Dg Lumbar Spine 2-3 Views  03/07/2015  CLINICAL DATA:  74 year old female with pressure ulcer in the lower back. Rule out osteomyelitis. EXAM: LUMBAR SPINE - 2-3 VIEW; DG HIP (WITH OR WITHOUT PELVIS) 2V BILAT COMPARISON:  Radiograph dated 04/19/2014 FINDINGS: There is osteopenia with multilevel diffuse degenerative changes of the spine. There is chronic appearing compression of the L5 vertebra. There is grade 1 L4-5 anterolisthesis. No acute fracture or dislocation. Right femoral intra medullary hardware. Constipation.  An IVC filter is noted. IMPRESSION: Osteopenia with multilevel degenerative changes of the spine. No acute fracture or dislocation. MRI or a white blood cell nuclear scan may provide better evaluation if osteomyelitis is clinically suspected. Electronically Signed   By: Elgie Collard M.D.   On: 03/07/2015 21:28   Mr Pelvis Wo Contrast  03/08/2015  CLINICAL DATA:  Sacral decubitus ulcer.  Evaluate for osteomyelitis. EXAM: MRI PELVIS WITHOUT CONTRAST TECHNIQUE: Multiplanar multisequence MR imaging of the pelvis was performed. No intravenous contrast was administered. COMPARISON:  None. FINDINGS: There is severe patient motion degrading image quality limiting evaluation. Bone/soft tissue There is a sacral decubitus ulcer with surrounding edema. There is no underlying focal marrow signal abnormality. There is a 5 x 2 x 5 cm fluid collection along the left inferolateral aspect of the sacrum. There is no other marrow step scratch at there is no marrow signal abnormality. Is no acute fracture, dislocation or avascular necrosis. There is no evidence of osteomyelitis of the sacrum. Alignment  Normal. No subluxation. Dysplasia None. Joint effusion None. Cartilage Femoral cartilage: No focal chondral defect. Acetabular cartilage: No focal chondral defect. Capsule and ligaments Normal. Muscles and Tendons No focal abnormality.  Generalized muscle atrophy. Viscera Normal. No abnormality seen in pelvis. No lymphadenopathy. No free fluid in the pelvis. IMPRESSION: 1. Sacral decubitus ulcer with surrounding edema without evidence of osteomyelitis. 5 x 2 x 5 cm fluid collection along the left inferolateral aspect of the sacrum concerning for abscess. Electronically Signed   By: Elige Ko   On: 03/08/2015 10:13   Dg Chest Port 1 View  03/07/2015  CLINICAL DATA:  Pt, from home, presents for sacral/buttock pressure ulcer wound check. EXAM: PORTABLE CHEST 1 VIEW COMPARISON:  04/18/2014 FINDINGS: Cardiac silhouette normal in size and configuration. No mediastinal or hilar masses or convincing adenopathy. Clear lungs.  No pleural effusion or pneumothorax. Bony thorax is demineralized but grossly intact. IMPRESSION: No active disease. Electronically Signed   By: Amie Portland M.D.   On: 03/07/2015 20:00   Dg Hips Bilat With Pelvis 2v  03/07/2015  CLINICAL DATA:  74 year old female with pressure ulcer in the lower back. Rule out osteomyelitis. EXAM: LUMBAR SPINE - 2-3 VIEW; DG HIP (WITH OR WITHOUT PELVIS) 2V BILAT COMPARISON:  Radiograph dated 04/19/2014 FINDINGS: There is osteopenia with multilevel diffuse degenerative changes of the spine. There is chronic appearing compression of the L5 vertebra. There is grade 1 L4-5 anterolisthesis. No acute fracture or dislocation. Right femoral intra medullary hardware. Constipation.  An IVC filter is noted. IMPRESSION: Osteopenia with multilevel degenerative changes of the spine. No acute fracture or dislocation. MRI or a white blood cell nuclear scan may provide better evaluation if osteomyelitis is clinically suspected. Electronically Signed   By: Elgie Collard  M.D.   On: 03/07/2015 21:28    Scheduled Meds: . collagenase   Topical Daily  . fenofibrate  160 mg Oral Daily  . glipiZIDE  5 mg Oral Daily  . insulin aspart  0-5 Units Subcutaneous QHS  . insulin aspart  0-9 Units Subcutaneous TID WC  . piperacillin-tazobactam (ZOSYN)  IV  3.375 g Intravenous 3 times per day  . protein supplement shake  11 oz Oral Daily  . ramipril  10 mg Oral Daily  . rivaroxaban  20 mg Oral Q supper  . vancomycin  500 mg Intravenous Q12H   Continuous Infusions:    Time spent: > 35 minutes    Penny Pia  [facility] Pager [phone] If 7PM-7AM, please contact night-coverage at [email or web address], password [facility] 03/08/2015, 6:01 PM  LOS: 1 day

## 2015-03-08 NOTE — Progress Notes (Signed)
[facility]  Patient Status: Active (receiving services up to time of hospitalization)  [facility] is providing the following services: RN  If patient discharges after hours, please call [phone].   Avie EchevariaKaren Nussbaum 03/08/2015, 10:00 AM

## 2015-03-08 NOTE — Progress Notes (Signed)
Utilization review completed.  

## 2015-03-08 NOTE — Progress Notes (Signed)
ANTIBIOTIC CONSULT NOTE - INITIAL  Pharmacy Consult for Vancomycin and Zosyn  Indication: wound infection  Allergies  Allergen Reactions  . Allevyn Adhesive [Wound Dressings] Rash    Patient Measurements: Height: 5\' 6"  (167.6 cm) Weight: 139 lb 1.8 oz (63.1 kg) IBW/kg (Calculated) : 59.3 Adjusted Body Weight:   Vital Signs: Temp: 98.1 F (36.7 C) (12/23 0119) Temp Source: Axillary (12/23 0119) BP: 143/74 mmHg (12/23 0119) Pulse Rate: 73 (12/23 0119) Intake/Output from previous day:   Intake/Output from this shift:    Labs:  Recent Labs  03/07/15 1944 03/07/15 1949 03/08/15 0443  WBC  --  12.2* 10.9*  HGB 14.3 13.0 10.9*  PLT  --  423* 382  CREATININE 0.40* 0.58 0.63   Estimated Creatinine Clearance: 59.5 mL/min (by C-G formula based on Cr of 0.63). No results for input(s): VANCOTROUGH, VANCOPEAK, VANCORANDOM, GENTTROUGH, GENTPEAK, GENTRANDOM, TOBRATROUGH, TOBRAPEAK, TOBRARND, AMIKACINPEAK, AMIKACINTROU, AMIKACIN in the last 72 hours.   Microbiology: No results found for this or any previous visit (from the past 720 hour(s)).  Medical History: Past Medical History  Diagnosis Date  . Mental retardation   . Diabetes mellitus without complication (HCC)   . Hypertension   . Hypercholesterolemia   . DM (diabetes mellitus), type 2 (HCC) 03/28/2014  . Hyperlipidemia 03/28/2014  . DVT (deep venous thrombosis) (HCC)     Medications:  Anti-infectives    Start     Dose/Rate Route Frequency Ordered Stop   03/08/15 1000  vancomycin (VANCOCIN) 500 mg in sodium chloride 0.9 % 100 mL IVPB     500 mg 100 mL/hr over 60 Minutes Intravenous Every 12 hours 03/08/15 0525     03/08/15 0600  piperacillin-tazobactam (ZOSYN) IVPB 3.375 g     3.375 g 12.5 mL/hr over 240 Minutes Intravenous 3 times per day 03/08/15 0525     03/07/15 1930  vancomycin (VANCOCIN) IVPB 1000 mg/200 mL premix     1,000 mg 200 mL/hr over 60 Minutes Intravenous  Once 03/07/15 1905 03/07/15 2221   03/07/15 1915  piperacillin-tazobactam (ZOSYN) IVPB 3.375 g     3.375 g 100 mL/hr over 30 Minutes Intravenous  Once 03/07/15 1903 03/07/15 2118     Assessment: Patient with sacral ulcer.  H&P notes wound odor, "pus pocket"? and early systemic infection.  First dose of antibiotics already given.  Goal of Therapy:  Vancomycin trough level 15-20 mcg/ml  Zosyn based on renal function Appropriate antibiotic dosing for renal function; eradication of infection   Plan:  Measure antibiotic drug levels at steady state Follow up culture results Vancomycin 500mg  iv q12hr  Zosyn 3.375g IV Q8H infused over 4hrs.   Darlina GuysGrimsley Jr, Jacquenette ShoneJulian Crowford 03/08/2015,5:31 AM

## 2015-03-09 DIAGNOSIS — L89109 Pressure ulcer of unspecified part of back, unspecified stage: Secondary | ICD-10-CM

## 2015-03-09 LAB — URINE CULTURE: Culture: NO GROWTH

## 2015-03-09 LAB — CBC
HEMATOCRIT: 32 % — AB (ref 36.0–46.0)
HEMOGLOBIN: 10.6 g/dL — AB (ref 12.0–15.0)
MCH: 29.2 pg (ref 26.0–34.0)
MCHC: 33.1 g/dL (ref 30.0–36.0)
MCV: 88.2 fL (ref 78.0–100.0)
Platelets: 367 10*3/uL (ref 150–400)
RBC: 3.63 MIL/uL — AB (ref 3.87–5.11)
RDW: 12.4 % (ref 11.5–15.5)
WBC: 11.7 10*3/uL — AB (ref 4.0–10.5)

## 2015-03-09 LAB — GLUCOSE, CAPILLARY
GLUCOSE-CAPILLARY: 129 mg/dL — AB (ref 65–99)
Glucose-Capillary: 139 mg/dL — ABNORMAL HIGH (ref 65–99)
Glucose-Capillary: 143 mg/dL — ABNORMAL HIGH (ref 65–99)
Glucose-Capillary: 214 mg/dL — ABNORMAL HIGH (ref 65–99)

## 2015-03-09 MED ORDER — SENNOSIDES-DOCUSATE SODIUM 8.6-50 MG PO TABS
1.0000 | ORAL_TABLET | Freq: Every day | ORAL | Status: DC
  Administered 2015-03-09 – 2015-03-13 (×5): 1 via ORAL
  Filled 2015-03-09 (×5): qty 1

## 2015-03-09 NOTE — Progress Notes (Signed)
[facility] PROGRESS NOTE  Crystal Robinson YNW:[id number] DOB: October 08, 1940 DOA: 03/07/2015 PCP: Marletta Lor, NP  Assessment/Plan: Principal Problem:   Cellulitis - continue antibiotic therapy - wound care nurse on board - General surgery consulted, appreciate their recommendations. Plans will be to obtain ultrasound of area to assess for fluid collection.  Active Problems:   Mental retardation   Essential hypertension   Controlled type 2 diabetes mellitus without complication, without long-term current use of insulin (HCC) - Currently not well controlled will continue glipizide and sliding scale insulin. If blood sugars not improved then we'll plan on increasing sliding scale insulin regimen - Blood sugars elevated most likely elevated secondary to active infection   DVT (deep venous thrombosis) (HCC) - Plan will be to hold xarelto for the next 2-3 days due to possibility of surgical vs percutaneous drainage if fluid collection is evident.   Pressure ulcer of back - Consult general surgery on board   Hypercalcemia - Resolved with IV fluid rehydration   Protein-calorie malnutrition, moderate (HCC) - Continue nutritional supplementation  Code Status: DO NOT RESUSCITATE Family Communication: Discussed with family Disposition Plan: Pending further work up   Consultants:  General surgery  Procedures:  None  Antibiotics:  Zosyn and Vancomycin  HPI/Subjective: Pt is non verbal, discussed case with family  Objective: Filed Vitals:   03/09/15 0654 03/09/15 1455  BP: 120/60 121/80  Pulse: 71 100  Temp: 98.7 F (37.1 C) 98.2 F (36.8 C)  Resp: 16 18    Intake/Output Summary (Last 24 hours) at 03/09/15 1542 Last data filed at 03/09/15 1509  Gross per 24 hour  Intake    880 ml  Output      0 ml  Net    880 ml   Filed Weights   03/07/15 2134 03/08/15 0119  Weight: 72.576 kg (160 lb) 63.1 kg (139 lb 1.8 oz)    Exam:   General:  Pt in nad, alert and  awake  Cardiovascular: rrr, no mrg  Respiratory: cta bl, no wheezes  Abdomen: soft, nd, nt  Musculoskeletal: no cyanosis on limited exam at upper extremities   Data Reviewed: Basic Metabolic Panel:  Recent Labs Lab 03/07/15 1944 03/07/15 1949 03/08/15 0443  NA 143 143 145  K 3.5 3.5 3.5  CL 104 106 110  CO2  --  27 28  GLUCOSE 323* 330* 278*  BUN CREATININE 0.40* 0.58 0.63  CALCIUM  --  10.4* 10.1   Liver Function Tests:  Recent Labs Lab 03/07/15 1949  AST 15  ALT 10*  ALKPHOS 67  BILITOT 0.7  PROT 7.7  ALBUMIN 3.2*   No results for input(s): LIPASE, AMYLASE in the last 168 hours. No results for input(s): AMMONIA in the last 168 hours. CBC:  Recent Labs Lab 03/07/15 1944 03/07/15 1949 03/08/15 0443 03/09/15 0412  WBC  --  12.2* 10.9* 11.7*  NEUTROABS  --  9.6*  --   --   HGB 14.3 13.0 10.9* 10.6*  HCT 42.0 39.1 32.9* 32.0*  MCV  --  90.3 88.9 88.2  PLT  --  423* 382 367   Cardiac Enzymes: No results for input(s): CKTOTAL, CKMB, CKMBINDEX, TROPONINI in the last 168 hours. BNP (last 3 results) No results for input(s): BNP in the last 8760 hours.  ProBNP (last 3 results) No results for input(s): PROBNP in the last 8760 hours.  CBG:  Recent Labs Lab 03/08/15 1210 03/08/15 1732 03/08/15 2051 03/09/15 0751 03/09/15 1224  GLUCAP 193* 234* 278* 139* 129*    Recent Results (from the past 240 hour(s))  Blood Culture (routine x 2)     Status: None (Preliminary result)   Collection Time: 03/07/15  7:25 PM  Result Value Ref Range Status   Specimen Description BLOOD RIGHT WRIST  Final   Special Requests BOTTLES DRAWN AEROBIC AND ANAEROBIC 5CC EACH  Final   Culture   Final    NO GROWTH 2 DAYS [facility]    Report Status PENDING  Incomplete  Blood Culture (routine x 2)     Status: None (Preliminary result)   Collection Time: 03/07/15  8:05 PM  Result Value Ref Range Status   Specimen Description BLOOD LEFT  ANTECUBITAL  Final   Special Requests BOTTLES DRAWN AEROBIC AND ANAEROBIC 5CC EACH  Final   Culture   Final    NO GROWTH 2 DAYS Performed at [facility]    Report Status PENDING  Incomplete  Urine culture     Status: None   Collection Time: 03/07/15  8:37 PM  Result Value Ref Range Status   Specimen Description URINE, CATHETERIZED  Final   Special Requests NONE  Final   Culture   Final    NO GROWTH 2 DAYS Performed at [facility]    Report Status 03/09/2015 FINAL  Final     Studies: Dg Lumbar Spine 2-3 Views  03/07/2015  CLINICAL DATA:  74 year old female with pressure ulcer in the lower back. Rule out osteomyelitis. EXAM: LUMBAR SPINE - 2-3 VIEW; DG HIP (WITH OR WITHOUT PELVIS) 2V BILAT COMPARISON:  Radiograph dated 04/19/2014 FINDINGS: There is osteopenia with multilevel diffuse degenerative changes of the spine. There is chronic appearing compression of the L5 vertebra. There is grade 1 L4-5 anterolisthesis. No acute fracture or dislocation. Right femoral intra medullary hardware. Constipation.  An IVC filter is noted. IMPRESSION: Osteopenia with multilevel degenerative changes of the spine. No acute fracture or dislocation. MRI or a white blood cell nuclear scan may provide better evaluation if osteomyelitis is clinically suspected. Electronically Signed   By: Elgie CollardArash  Radparvar M.D.   On: 03/07/2015 21:28   Mr Pelvis Wo Contrast  03/08/2015  CLINICAL DATA:  Sacral decubitus ulcer.  Evaluate for osteomyelitis. EXAM: MRI PELVIS WITHOUT CONTRAST TECHNIQUE: Multiplanar multisequence MR imaging of the pelvis was performed. No intravenous contrast was administered. COMPARISON:  None. FINDINGS: There is severe patient motion degrading image quality limiting evaluation. Bone/soft tissue There is a sacral decubitus ulcer with surrounding edema. There is no underlying focal marrow signal abnormality. There is a 5 x 2 x 5 cm fluid collection along the left inferolateral aspect of  the sacrum. There is no other marrow step scratch at there is no marrow signal abnormality. Is no acute fracture, dislocation or avascular necrosis. There is no evidence of osteomyelitis of the sacrum. Alignment Normal. No subluxation. Dysplasia None. Joint effusion None. Cartilage Femoral cartilage: No focal chondral defect. Acetabular cartilage: No focal chondral defect. Capsule and ligaments Normal. Muscles and Tendons No focal abnormality.  Generalized muscle atrophy. Viscera Normal. No abnormality seen in pelvis. No lymphadenopathy. No free fluid in the pelvis. IMPRESSION: 1. Sacral decubitus ulcer with surrounding edema without evidence of osteomyelitis. 5 x 2 x 5 cm fluid collection along the left inferolateral aspect of the sacrum concerning for abscess. Electronically Signed   By: Elige KoHetal  Patel   On: 03/08/2015 10:13   Dg Chest Port 1 View  03/07/2015  CLINICAL DATA:  Pt, from home, presents for sacral/buttock pressure ulcer wound check. EXAM: PORTABLE CHEST 1 VIEW COMPARISON:  04/18/2014 FINDINGS: Cardiac silhouette normal in size and configuration. No mediastinal or hilar masses or convincing adenopathy. Clear lungs. No pleural effusion or pneumothorax. Bony thorax is demineralized but grossly intact. IMPRESSION: No active disease. Electronically Signed   By: Amie Portland M.D.   On: 03/07/2015 20:00   Dg Hips Bilat With Pelvis 2v  03/07/2015  CLINICAL DATA:  74 year old female with pressure ulcer in the lower back. Rule out osteomyelitis. EXAM: LUMBAR SPINE - 2-3 VIEW; DG HIP (WITH OR WITHOUT PELVIS) 2V BILAT COMPARISON:  Radiograph dated 04/19/2014 FINDINGS: There is osteopenia with multilevel diffuse degenerative changes of the spine. There is chronic appearing compression of the L5 vertebra. There is grade 1 L4-5 anterolisthesis. No acute fracture or dislocation. Right femoral intra medullary hardware. Constipation.  An IVC filter is noted. IMPRESSION: Osteopenia with multilevel degenerative  changes of the spine. No acute fracture or dislocation. MRI or a white blood cell nuclear scan may provide better evaluation if osteomyelitis is clinically suspected. Electronically Signed   By: Elgie Collard M.D.   On: 03/07/2015 21:28    Scheduled Meds: . collagenase   Topical Daily  . fenofibrate  160 mg Oral Daily  . glipiZIDE  5 mg Oral Daily  . insulin aspart  0-5 Units Subcutaneous QHS  . insulin aspart  0-9 Units Subcutaneous TID WC  . piperacillin-tazobactam (ZOSYN)  IV  3.375 g Intravenous 3 times per day  . protein supplement shake  11 oz Oral Daily  . ramipril  10 mg Oral Daily  . senna-docusate  1 tablet Oral QHS  . vancomycin  500 mg Intravenous Q12H   Continuous Infusions:    Time spent: > 35 minutes    Penny Pia  [facility] Pager [phone] If 7PM-7AM, please contact night-coverage at [email or web address], password [facility] 03/09/2015, 3:42 PM  LOS: 2 days

## 2015-03-09 NOTE — Consult Note (Signed)
Reason for Consult:  Infected Sacral decubitus ulcer Referring Physician:  Dr. Waynard Edwards Tremain is an 74 y.o. female.  HPI: this is a 74 year old bedbound female with a progressively increasing and recently more infected sacral decubitus ulcer. She had a right femur fracture earlier this year and and developed to decubitus ulcers. This started in February of this year. One healed but one has been nonhealing. Recently, topical agents were prescribed by home health nursing. After that was done, the ulcer got significantly worse with respect to looking infected. She subsequently came to the emergency department and was admitted.  MRI demonstrated a 5 cm fluid collection around the left inferolateral aspect of the sacrum. It is for this reason I was asked to see her.  She has been on intravenous antibiotics since being admitted to the hospital 2 days ago. She is also on chronic Xarelto or deep venous thrombosis. She has an IVC filter in.  Past Medical History  Diagnosis Date  . Mental retardation   . Diabetes mellitus without complication ([place])   . Hypertension   . Hypercholesterolemia   . DM (diabetes mellitus), type 2 ([place]) 03/28/2014  . Hyperlipidemia 03/28/2014  . DVT (deep venous thrombosis) [facility])     Past Surgical History  Procedure Laterality Date  . Abdominal hysterectomy    . Fracture surgery    . Femur im nail Right 04/18/2014    Procedure: INTRAMEDULLARY  RETROGRADE FEMORAL NAILING;  Surgeon: Rozanna Box, MD;  Location: [facility];  Service: Orthopedics;  Laterality: Right;    Family History  Problem Relation Age of Onset  . Bladder Cancer Mother   . Hypertension Mother   . Congestive Heart Failure Mother   . Diabetes Mother     Social History:  reports that she has never smoked. She has never used smokeless tobacco. She reports that she does not drink alcohol or use illicit drugs.  Allergies:  Allergies  Allergen Reactions  . Allevyn Adhesive [Wound Dressings] Rash      Current facility-administered medications:  .  acetaminophen (TYLENOL) tablet 650 mg, 650 mg, Oral, Q6H PRN, Velvet Bathe, MD, 650 mg at 03/09/15 1006 .  collagenase (SANTYL) ointment, , Topical, Daily, Velvet Bathe, MD .  fenofibrate tablet 160 mg, 160 mg, Oral, Daily, Edwin Dada, MD, 160 mg at 03/09/15 1006 .  glipiZIDE (GLUCOTROL XL) 24 hr tablet 5 mg, 5 mg, Oral, Daily, Edwin Dada, MD, 5 mg at 03/09/15 1006 .  insulin aspart (novoLOG) injection 0-5 Units, 0-5 Units, Subcutaneous, QHS, Edwin Dada, MD, 3 Units at 03/08/15 2107 .  insulin aspart (novoLOG) injection 0-9 Units, 0-9 Units, Subcutaneous, TID WC, Edwin Dada, MD, 1 Units at 03/09/15 0810 .  ondansetron (ZOFRAN) tablet 4 mg, 4 mg, Oral, Q6H PRN **OR** ondansetron (ZOFRAN) injection 4 mg, 4 mg, Intravenous, Q6H PRN, Edwin Dada, MD .  piperacillin-tazobactam (ZOSYN) IVPB 3.375 g, 3.375 g, Intravenous, 3 times per day, Edwin Dada, MD, 3.375 g at 03/09/15 0524 .  protein supplement (PREMIER PROTEIN) liquid, 11 oz, Oral, Daily, Maricela Bo Ostheim, RD, 11 oz at 03/08/15 1016 .  ramipril (ALTACE) capsule 10 mg, 10 mg, Oral, Daily, Edwin Dada, MD, 10 mg at 03/09/15 1006 .  senna-docusate (Senokot-S) tablet 1 tablet, 1 tablet, Oral, QHS PRN, Dianne Dun, NP, 1 tablet at 03/08/15 0245 .  vancomycin (VANCOCIN) 500 mg in sodium chloride 0.9 % 100 mL IVPB, 500 mg, Intravenous, Q12H, Edwin Dada, MD,  500 mg at 03/09/15 1006   Results for orders placed or performed during the hospital encounter of 03/07/15 (from the past 48 hour(s))  CBG monitoring, ED     Status: Abnormal   Collection Time: 03/07/15  5:05 PM  Result Value Ref Range   Glucose-Capillary 328 (H) 65 - 99 mg/dL  Blood Culture (routine x 2)     Status: None (Preliminary result)   Collection Time: 03/07/15  7:25 PM  Result Value Ref Range   Specimen Description BLOOD RIGHT WRIST     Special Requests BOTTLES DRAWN AEROBIC AND ANAEROBIC 5CC EACH    Culture      NO GROWTH < 24 HOURS Performed at [facility]    Report Status PENDING   I-Stat CG4 Lactic Acid, ED  (not at  [facility])     Status: None   Collection Time: 03/07/15  7:44 PM  Result Value Ref Range   Lactic Acid, Venous 1.73 0.5 - 2.0 mmol/L  I-stat Chem 8, ED     Status: Abnormal   Collection Time: 03/07/15  7:44 PM  Result Value Ref Range   Sodium 143 135 - 145 mmol/L   Potassium 3.5 3.5 - 5.1 mmol/L   Chloride 104 101 - 111 mmol/L   BUN 13 6 - 20 mg/dL   Creatinine, Ser 0.40 (L) 0.44 - 1.00 mg/dL   Glucose, Bld 323 (H) 65 - 99 mg/dL   Calcium, Ion 1.35 (H) 1.13 - 1.30 mmol/L   TCO2 28 0 - 100 mmol/L   Hemoglobin 14.3 12.0 - 15.0 g/dL   HCT 42.0 36.0 - 46.0 %  Comprehensive metabolic panel     Status: Abnormal   Collection Time: 03/07/15  7:49 PM  Result Value Ref Range   Sodium 143 135 - 145 mmol/L   Potassium 3.5 3.5 - 5.1 mmol/L   Chloride 106 101 - 111 mmol/L   CO2 27 22 - 32 mmol/L   Glucose, Bld 330 (H) 65 - 99 mg/dL   BUN 13 6 - 20 mg/dL   Creatinine, Ser 0.58 0.44 - 1.00 mg/dL   Calcium 10.4 (H) 8.9 - 10.3 mg/dL   Total Protein 7.7 6.5 - 8.1 g/dL   Albumin 3.2 (L) 3.5 - 5.0 g/dL   AST 15 15 - 41 U/L   ALT 10 (L) 14 - 54 U/L   Alkaline Phosphatase 67 38 - 126 U/L   Total Bilirubin 0.7 0.3 - 1.2 mg/dL   GFR calc non Af Amer >60 >60 mL/min   GFR calc Af Amer >60 >60 mL/min    Comment: (NOTE) The eGFR has been calculated using the CKD EPI equation. This calculation has not been validated in all clinical situations. eGFR's persistently <60 mL/min signify possible Chronic Kidney Disease.    Anion gap 10 5 - 15  CBC WITH DIFFERENTIAL     Status: Abnormal   Collection Time: 03/07/15  7:49 PM  Result Value Ref Range   WBC 12.2 (H) 4.0 - 10.5 K/uL   RBC 4.33 3.87 - 5.11 MIL/uL   Hemoglobin 13.0 12.0 - 15.0 g/dL   HCT 39.1 36.0 - 46.0 %   MCV 90.3 78.0 - 100.0 fL   MCH 30.0 26.0 -  34.0 pg   MCHC 33.2 30.0 - 36.0 g/dL   RDW 12.6 11.5 - 15.5 %   Platelets 423 (H) 150 - 400 K/uL   Neutrophils Relative % 79 %   Neutro Abs 9.6 (H) 1.7 - 7.7 K/uL  Lymphocytes Relative 12 %   Lymphs Abs 1.5 0.7 - 4.0 K/uL   Monocytes Relative 8 %   Monocytes Absolute 1.0 0.1 - 1.0 K/uL   Eosinophils Relative 1 %   Eosinophils Absolute 0.1 0.0 - 0.7 K/uL   Basophils Relative 0 %   Basophils Absolute 0.0 0.0 - 0.1 K/uL  Blood Culture (routine x 2)     Status: None (Preliminary result)   Collection Time: 03/07/15  8:05 PM  Result Value Ref Range   Specimen Description BLOOD LEFT ANTECUBITAL    Special Requests BOTTLES DRAWN AEROBIC AND ANAEROBIC 5CC EACH    Culture      NO GROWTH < 24 HOURS Performed at [facility]    Report Status PENDING   Urinalysis, Routine w reflex microscopic (not at [facility])     Status: Abnormal   Collection Time: 03/07/15  8:37 PM  Result Value Ref Range   Color, Urine YELLOW YELLOW   APPearance CLOUDY (A) CLEAR   Specific Gravity, Urine 1.032 (H) 1.005 - 1.030   pH 6.0 5.0 - 8.0   Glucose, UA >1000 (A) NEGATIVE mg/dL   Hgb urine dipstick NEGATIVE NEGATIVE   Bilirubin Urine NEGATIVE NEGATIVE   Ketones, ur NEGATIVE NEGATIVE mg/dL   Protein, ur NEGATIVE NEGATIVE mg/dL   Nitrite NEGATIVE NEGATIVE   Leukocytes, UA NEGATIVE NEGATIVE  Urine culture     Status: None (Preliminary result)   Collection Time: 03/07/15  8:37 PM  Result Value Ref Range   Specimen Description URINE, CATHETERIZED    Special Requests NONE    Culture      NO GROWTH < 24 HOURS Performed at [facility]    Report Status PENDING   Urine microscopic-add on     Status: Abnormal   Collection Time: 03/07/15  8:37 PM  Result Value Ref Range   Squamous Epithelial / LPF 0-5 (A) NONE SEEN   WBC, UA 0-5 0 - 5 WBC/hpf   RBC / HPF NONE SEEN 0 - 5 RBC/hpf   Bacteria, UA NONE SEEN NONE SEEN   Urine-Other AMORPHOUS URATES/PHOSPHATES   POC CBG, ED     Status: Abnormal    Collection Time: 03/07/15  9:31 PM  Result Value Ref Range   Glucose-Capillary 253 (H) 65 - 99 mg/dL  I-Stat CG4 Lactic Acid, ED  (not at  [facility])     Status: None   Collection Time: 03/07/15 10:34 PM  Result Value Ref Range   Lactic Acid, Venous 0.96 0.5 - 2.0 mmol/L  Glucose, capillary     Status: Abnormal   Collection Time: 03/08/15  1:50 AM  Result Value Ref Range   Glucose-Capillary 278 (H) 65 - 99 mg/dL   Comment 1 Notify RN   Basic metabolic panel     Status: Abnormal   Collection Time: 03/08/15  4:43 AM  Result Value Ref Range   Sodium 145 135 - 145 mmol/L   Potassium 3.5 3.5 - 5.1 mmol/L   Chloride 110 101 - 111 mmol/L   CO2 28 22 - 32 mmol/L   Glucose, Bld 278 (H) 65 - 99 mg/dL   BUN 10 6 - 20 mg/dL   Creatinine, Ser 0.63 0.44 - 1.00 mg/dL   Calcium 10.1 8.9 - 10.3 mg/dL   GFR calc non Af Amer >60 >60 mL/min   GFR calc Af Amer >60 >60 mL/min    Comment: (NOTE) The eGFR has been calculated using the CKD EPI equation. This calculation has not  been validated in all clinical situations. eGFR's persistently <60 mL/min signify possible Chronic Kidney Disease.    Anion gap 7 5 - 15  CBC     Status: Abnormal   Collection Time: 03/08/15  4:43 AM  Result Value Ref Range   WBC 10.9 (H) 4.0 - 10.5 K/uL   RBC 3.70 (L) 3.87 - 5.11 MIL/uL   Hemoglobin 10.9 (L) 12.0 - 15.0 g/dL   HCT 32.9 (L) 36.0 - 46.0 %   MCV 88.9 78.0 - 100.0 fL   MCH 29.5 26.0 - 34.0 pg   MCHC 33.1 30.0 - 36.0 g/dL   RDW 12.5 11.5 - 15.5 %   Platelets 382 150 - 400 K/uL  Glucose, capillary     Status: Abnormal   Collection Time: 03/08/15  8:01 AM  Result Value Ref Range   Glucose-Capillary 277 (H) 65 - 99 mg/dL  Glucose, capillary     Status: Abnormal   Collection Time: 03/08/15 12:10 PM  Result Value Ref Range   Glucose-Capillary 193 (H) 65 - 99 mg/dL   Comment 1 Notify RN    Comment 2 Document in Chart   Glucose, capillary     Status: Abnormal   Collection Time: 03/08/15  5:32 PM  Result Value  Ref Range   Glucose-Capillary 234 (H) 65 - 99 mg/dL   Comment 1 Notify RN    Comment 2 Document in Chart   Glucose, capillary     Status: Abnormal   Collection Time: 03/08/15  8:51 PM  Result Value Ref Range   Glucose-Capillary 278 (H) 65 - 99 mg/dL   Comment 1 Notify RN   CBC     Status: Abnormal   Collection Time: 03/09/15  4:12 AM  Result Value Ref Range   WBC 11.7 (H) 4.0 - 10.5 K/uL   RBC 3.63 (L) 3.87 - 5.11 MIL/uL   Hemoglobin 10.6 (L) 12.0 - 15.0 g/dL   HCT 32.0 (L) 36.0 - 46.0 %   MCV 88.2 78.0 - 100.0 fL   MCH 29.2 26.0 - 34.0 pg   MCHC 33.1 30.0 - 36.0 g/dL   RDW 12.4 11.5 - 15.5 %   Platelets 367 150 - 400 K/uL  Glucose, capillary     Status: Abnormal   Collection Time: 03/09/15  7:51 AM  Result Value Ref Range   Glucose-Capillary 139 (H) 65 - 99 mg/dL    Dg Lumbar Spine 2-3 Views  03/07/2015  CLINICAL DATA:  74 year old female with pressure ulcer in the lower back. Rule out osteomyelitis. EXAM: LUMBAR SPINE - 2-3 VIEW; DG HIP (WITH OR WITHOUT PELVIS) 2V BILAT COMPARISON:  Radiograph dated 04/19/2014 FINDINGS: There is osteopenia with multilevel diffuse degenerative changes of the spine. There is chronic appearing compression of the L5 vertebra. There is grade 1 L4-5 anterolisthesis. No acute fracture or dislocation. Right femoral intra medullary hardware. Constipation.  An IVC filter is noted. IMPRESSION: Osteopenia with multilevel degenerative changes of the spine. No acute fracture or dislocation. MRI or a white blood cell nuclear scan may provide better evaluation if osteomyelitis is clinically suspected. Electronically Signed   By: Anner Crete M.D.   On: 03/07/2015 21:28   Mr Pelvis Wo Contrast  03/08/2015  CLINICAL DATA:  Sacral decubitus ulcer.  Evaluate for osteomyelitis. EXAM: MRI PELVIS WITHOUT CONTRAST TECHNIQUE: Multiplanar multisequence MR imaging of the pelvis was performed. No intravenous contrast was administered. COMPARISON:  None. FINDINGS: There is  severe patient motion degrading image quality limiting evaluation. Bone/soft tissue  There is a sacral decubitus ulcer with surrounding edema. There is no underlying focal marrow signal abnormality. There is a 5 x 2 x 5 cm fluid collection along the left inferolateral aspect of the sacrum. There is no other marrow step scratch at there is no marrow signal abnormality. Is no acute fracture, dislocation or avascular necrosis. There is no evidence of osteomyelitis of the sacrum. Alignment Normal. No subluxation. Dysplasia None. Joint effusion None. Cartilage Femoral cartilage: No focal chondral defect. Acetabular cartilage: No focal chondral defect. Capsule and ligaments Normal. Muscles and Tendons No focal abnormality.  Generalized muscle atrophy. Viscera Normal. No abnormality seen in pelvis. No lymphadenopathy. No free fluid in the pelvis. IMPRESSION: 1. Sacral decubitus ulcer with surrounding edema without evidence of osteomyelitis. 5 x 2 x 5 cm fluid collection along the left inferolateral aspect of the sacrum concerning for abscess. Electronically Signed   By: Kathreen Devoid   On: 03/08/2015 10:13   Dg Chest Port 1 View  03/07/2015  CLINICAL DATA:  Pt, from home, presents for sacral/buttock pressure ulcer wound check. EXAM: PORTABLE CHEST 1 VIEW COMPARISON:  04/18/2014 FINDINGS: Cardiac silhouette normal in size and configuration. No mediastinal or hilar masses or convincing adenopathy. Clear lungs. No pleural effusion or pneumothorax. Bony thorax is demineralized but grossly intact. IMPRESSION: No active disease. Electronically Signed   By: Lajean Manes M.D.   On: 03/07/2015 20:00   Dg Hips Bilat With Pelvis 2v  03/07/2015  CLINICAL DATA:  74 year old female with pressure ulcer in the lower back. Rule out osteomyelitis. EXAM: LUMBAR SPINE - 2-3 VIEW; DG HIP (WITH OR WITHOUT PELVIS) 2V BILAT COMPARISON:  Radiograph dated 04/19/2014 FINDINGS: There is osteopenia with multilevel diffuse degenerative changes  of the spine. There is chronic appearing compression of the L5 vertebra. There is grade 1 L4-5 anterolisthesis. No acute fracture or dislocation. Right femoral intra medullary hardware. Constipation.  An IVC filter is noted. IMPRESSION: Osteopenia with multilevel degenerative changes of the spine. No acute fracture or dislocation. MRI or a white blood cell nuclear scan may provide better evaluation if osteomyelitis is clinically suspected. Electronically Signed   By: Anner Crete M.D.   On: 03/07/2015 21:28    Review of Systems  Unable to perform ROS: mental acuity   Blood pressure 120/60, pulse 71, temperature 98.7 F (37.1 C), temperature source Axillary, resp. rate 16, height 5' 6"  (1.676 m), weight 63.1 kg (139 lb 1.8 oz), SpO2 94 %. Physical Exam  Constitutional: She appears well-developed and well-nourished.  GI: Soft. She exhibits no distension.  Genitourinary:  Firm stool is protruding from the anus. Digital rectal exam demonstrates a significant fecal impaction. Digital disimpaction was performed and a large amount of stool was evacuated. The rectal vault was empty after this procedure.  Musculoskeletal:  There is a 6-7 cm open wound in the sacral area projecting to the left of the midline with a small 1 cm wound medial to this. Minimal necrotic tissue is present Both wounds bleed easily with only palpation.I digitally explored the larger wound in the inferior lateral aspect toward the left and did not encounter any fluid pockets. Externally, there is a small amount of cellulitis. In the inferior lateral aspect and left side I detect no fluctuance or induration.    Assessment/Plan: 1.  Infected, stage III decubitus ulcer with fluid collection seen on MRI that is not evident clinically. Currently on IV antibiotics and wet-to-dry dressing changes.Currently nothing can be seen clinically to drain but was seen  on MRI.  2.  Fecal impaction status post digital disimpaction.  3.  Chronic  anticoagulated state-wounds bleed very easily with minimal manipulation.  Recommendation: #1 start bowel regimen #2 check pre-albumin #3 continue IV antibiotics and increase dressing changes to 3 times a day #4 would recommend holding Xarelto and repeating an ultrasound in 48-72 hours on the area of concern. If the fluid collection is present, would recommend image guided percutaneous drainage of it.  Kitty Cadavid J 03/09/2015, 10:05 AM

## 2015-03-10 LAB — GLUCOSE, CAPILLARY
GLUCOSE-CAPILLARY: 218 mg/dL — AB (ref 65–99)
Glucose-Capillary: 95 mg/dL (ref 65–99)
Glucose-Capillary: 143 mg/dL — ABNORMAL HIGH (ref 65–99)
Glucose-Capillary: 224 mg/dL — ABNORMAL HIGH (ref 65–99)

## 2015-03-10 LAB — PREALBUMIN: Prealbumin: 8.5 mg/dL — ABNORMAL LOW (ref 18–38)

## 2015-03-10 LAB — CBC
HCT: 33 % — ABNORMAL LOW (ref 36.0–46.0)
HEMOGLOBIN: 10.8 g/dL — AB (ref 12.0–15.0)
MCH: 28.7 pg (ref 26.0–34.0)
MCHC: 32.7 g/dL (ref 30.0–36.0)
MCV: 87.8 fL (ref 78.0–100.0)
PLATELETS: 381 10*3/uL (ref 150–400)
RBC: 3.76 MIL/uL — AB (ref 3.87–5.11)
RDW: 12.4 % (ref 11.5–15.5)
WBC: 9.8 10*3/uL (ref 4.0–10.5)

## 2015-03-10 NOTE — Progress Notes (Signed)
  Subjective: Pt with no acute changes  Objective: Vital signs in last 24 hours: Temp:  [97.9 F (36.6 C)-99.7 F (37.6 C)] 97.9 F (36.6 C) (12/25 0457) Pulse Rate:  [63-100] 74 (12/25 0457) Resp:  [16-18] 16 (12/25 0457) BP: (121-151)/(73-80) 146/73 mmHg (12/25 0457) SpO2:  [97 %-99 %] 97 % (12/25 0457) Last BM Date: 03/09/15  Intake/Output from previous day: 12/24 0701 - 12/25 0700 In: 1218.1 [P.O.:922; IV Piggyback:296.1] Out: -  Intake/Output this shift:    General appearance: alert Incision/Wound: surrounding erythema, no palp fluctuance   Lab Results:   Recent Labs  03/09/15 0412 03/10/15 0430  WBC 11.7* 9.8  HGB 10.6* 10.8*  HCT 32.0* 33.0*  PLT 367 381   BMET  Recent Labs  03/07/15 1949 03/08/15 0443  NA 143 145  K 3.5 3.5  CL 106 110  CO2 27 28  GLUCOSE 330* 278*  BUN 13 10  CREATININE 0.58 0.63  CALCIUM 10.4* 10.1    Studies/Results: Mr Pelvis Wo Contrast  03/08/2015  CLINICAL DATA:  Sacral decubitus ulcer.  Evaluate for osteomyelitis. EXAM: MRI PELVIS WITHOUT CONTRAST TECHNIQUE: Multiplanar multisequence MR imaging of the pelvis was performed. No intravenous contrast was administered. COMPARISON:  None. FINDINGS: There is severe patient motion degrading image quality limiting evaluation. Bone/soft tissue There is a sacral decubitus ulcer with surrounding edema. There is no underlying focal marrow signal abnormality. There is a 5 x 2 x 5 cm fluid collection along the left inferolateral aspect of the sacrum. There is no other marrow step scratch at there is no marrow signal abnormality. Is no acute fracture, dislocation or avascular necrosis. There is no evidence of osteomyelitis of the sacrum. Alignment Normal. No subluxation. Dysplasia None. Joint effusion None. Cartilage Femoral cartilage: No focal chondral defect. Acetabular cartilage: No focal chondral defect. Capsule and ligaments Normal. Muscles and Tendons No focal abnormality.  Generalized  muscle atrophy. Viscera Normal. No abnormality seen in pelvis. No lymphadenopathy. No free fluid in the pelvis. IMPRESSION: 1. Sacral decubitus ulcer with surrounding edema without evidence of osteomyelitis. 5 x 2 x 5 cm fluid collection along the left inferolateral aspect of the sacrum concerning for abscess. Electronically Signed   By: Elige KoHetal  Patel   On: 03/08/2015 10:13    Anti-infectives: Anti-infectives    Start     Dose/Rate Route Frequency Ordered Stop   03/08/15 1000  vancomycin (VANCOCIN) 500 mg in sodium chloride 0.9 % 100 mL IVPB     500 mg 100 mL/hr over 60 Minutes Intravenous Every 12 hours 03/08/15 0525     03/08/15 0600  piperacillin-tazobactam (ZOSYN) IVPB 3.375 g     3.375 g 12.5 mL/hr over 240 Minutes Intravenous 3 times per day 03/08/15 0525     03/07/15 1930  vancomycin (VANCOCIN) IVPB 1000 mg/200 mL premix     1,000 mg 200 mL/hr over 60 Minutes Intravenous  Once 03/07/15 1905 03/07/15 2221   03/07/15 1915  piperacillin-tazobactam (ZOSYN) IVPB 3.375 g     3.375 g 100 mL/hr over 30 Minutes Intravenous  Once 03/07/15 1903 03/07/15 2118      Assessment/Plan:  1. Infected, stage III decub ulcer  -Con't abx  -Rec to hold Xarelto  -repeat US 24-48h and if fluid collection is present, would recommend image guided  percutaneous drainage of it.    LOS: 3 days    Marigene EhlersRamirez Jr., [facility] 03/10/2015

## 2015-03-10 NOTE — Progress Notes (Signed)
[facility] PROGRESS NOTE  Dwaine Deteratricia Streety BJY:[id number] DOB: September 27, 1940 DOA: 03/07/2015 PCP: Marletta LorBarr, Julie, NP  Assessment/Plan: Principal Problem:   Cellulitis - continue antibiotic therapy - wound care nurse on board - General surgery consulted, appreciate their recommendations. Plans will be to obtain ultrasound of area to assess for fluid collection in the next 1-2 days  Active Problems:   Mental retardation   Essential hypertension   Controlled type 2 diabetes mellitus without complication, without long-term current use of insulin (HCC) - Currently not well controlled will continue glipizide and sliding scale insulin. If blood sugars not improved then we'll plan on increasing sliding scale insulin regimen - Blood sugars elevated most likely elevated secondary to active infection   DVT (deep venous thrombosis) (HCC) - Plan will be to hold xarelto for the next 2-3 days due to possibility of surgical vs percutaneous drainage if fluid collection is evident.   Pressure ulcer of back - Consult general surgery on board   Hypercalcemia - Resolved with IV fluid rehydration   Protein-calorie malnutrition, moderate (HCC) - Continue nutritional supplementation  Code Status: DO NOT RESUSCITATE Family Communication: Discussed with family Disposition Plan: Pending further work up will need ultrasound to assess for definitive treatment recommendations.   Consultants:  General surgery  Procedures:  None  Antibiotics:  Zosyn and Vancomycin  HPI/Subjective: Pt is non verbal, discussed case with family. No new concerns reported.  Objective: Filed Vitals:   03/09/15 2135 03/10/15 0457  BP: 151/79 146/73  Pulse: 63 74  Temp: 99.7 F (37.6 C) 97.9 F (36.6 C)  Resp: 18 16    Intake/Output Summary (Last 24 hours) at 03/10/15 1350 Last data filed at 03/10/15 0930  Gross per 24 hour  Intake 1218.1 ml  Output      0 ml  Net 1218.1 ml   Filed Weights   03/07/15 2134  03/08/15 0119  Weight: 72.576 kg (160 lb) 63.1 kg (139 lb 1.8 oz)    Exam:   General:  Pt in nad, alert and awake  Cardiovascular: rrr, no mrg  Respiratory: cta bl, no wheezes  Abdomen: soft, nd, nt  Musculoskeletal: no cyanosis on limited exam at upper extremities   Data Reviewed: Basic Metabolic Panel:  Recent Labs Lab 03/07/15 1944 03/07/15 1949 03/08/15 0443  NA 143 143 145  K 3.5 3.5 3.5  CL 104 106 110  CO2  --  27 28  GLUCOSE 323* 330* 278*  BUN 13 13 10   CREATININE 0.40* 0.58 0.63  CALCIUM  --  10.4* 10.1   Liver Function Tests:  Recent Labs Lab 03/07/15 1949  AST 15  ALT 10*  ALKPHOS 67  BILITOT 0.7  PROT 7.7  ALBUMIN 3.2*   No results for input(s): LIPASE, AMYLASE in the last 168 hours. No results for input(s): AMMONIA in the last 168 hours. CBC:  Recent Labs Lab 03/07/15 1944 03/07/15 1949 03/08/15 0443 03/09/15 0412 03/10/15 0430  WBC  --  12.2* 10.9* 11.7* 9.8  NEUTROABS  --  9.6*  --   --   --   HGB 14.3 13.0 10.9* 10.6* 10.8*  HCT 42.0 39.1 32.9* 32.0* 33.0*  MCV  --  90.3 88.9 88.2 87.8  PLT  --  423* 382 367 381   Cardiac Enzymes: No results for input(s): CKTOTAL, CKMB, CKMBINDEX, TROPONINI in the last 168 hours. BNP (last 3 results) No results for input(s): BNP in the last 8760 hours.  ProBNP (last 3 results) No results for input(s): PROBNP  in the last 8760 hours.  CBG:  Recent Labs Lab 03/09/15 1224 03/09/15 1657 03/09/15 2119 03/10/15 0753 03/10/15 1215  GLUCAP 129* 143* 214* 95 143*    Recent Results (from the past 240 hour(s))  Blood Culture (routine x 2)     Status: None (Preliminary result)   Collection Time: 03/07/15  7:25 PM  Result Value Ref Range Status   Specimen Description BLOOD RIGHT WRIST  Final   Special Requests BOTTLES DRAWN AEROBIC AND ANAEROBIC 5CC EACH  Final   Culture   Final    NO GROWTH 2 DAYS Performed at [facility]    Report Status PENDING  Incomplete  Blood Culture  (routine x 2)     Status: None (Preliminary result)   Collection Time: 03/07/15  8:05 PM  Result Value Ref Range Status   Specimen Description BLOOD LEFT ANTECUBITAL  Final   Special Requests BOTTLES DRAWN AEROBIC AND ANAEROBIC 5CC EACH  Final   Culture   Final    NO GROWTH 2 DAYS Performed at [facility]    Report Status PENDING  Incomplete  Urine culture     Status: None   Collection Time: 03/07/15  8:37 PM  Result Value Ref Range Status   Specimen Description URINE, CATHETERIZED  Final   Special Requests NONE  Final   Culture   Final    NO GROWTH 2 DAYS Performed at [facility]    Report Status 03/09/2015 FINAL  Final     Studies: No results found.  Scheduled Meds: . collagenase   Topical Daily  . fenofibrate  160 mg Oral Daily  . glipiZIDE  5 mg Oral Daily  . insulin aspart  0-5 Units Subcutaneous QHS  . insulin aspart  0-9 Units Subcutaneous TID WC  . piperacillin-tazobactam (ZOSYN)  IV  3.375 g Intravenous 3 times per day  . protein supplement shake  11 oz Oral Daily  . ramipril  10 mg Oral Daily  . senna-docusate  1 tablet Oral QHS  . vancomycin  500 mg Intravenous Q12H   Continuous Infusions:    Time spent: > 35 minutes    Penny Pia  [facility] Pager [phone] If 7PM-7AM, please contact night-coverage at [email or web address], password [facility] 03/10/2015, 1:50 PM  LOS: 3 days

## 2015-03-11 ENCOUNTER — Inpatient Hospital Stay (HOSPITAL_COMMUNITY): Payer: Medicare Other

## 2015-03-11 LAB — CBC
HCT: 35.7 % — ABNORMAL LOW (ref 36.0–46.0)
Hemoglobin: 11.7 g/dL — ABNORMAL LOW (ref 12.0–15.0)
MCH: 29.3 pg (ref 26.0–34.0)
MCHC: 32.8 g/dL (ref 30.0–36.0)
MCV: 89.5 fL (ref 78.0–100.0)
PLATELETS: 460 10*3/uL — AB (ref 150–400)
RBC: 3.99 MIL/uL (ref 3.87–5.11)
RDW: 12.7 % (ref 11.5–15.5)
WBC: 11.2 10*3/uL — ABNORMAL HIGH (ref 4.0–10.5)

## 2015-03-11 LAB — GLUCOSE, CAPILLARY
GLUCOSE-CAPILLARY: 160 mg/dL — AB (ref 65–99)
GLUCOSE-CAPILLARY: 127 mg/dL — AB (ref 65–99)
GLUCOSE-CAPILLARY: 148 mg/dL — AB (ref 65–99)
Glucose-Capillary: 100 mg/dL — ABNORMAL HIGH (ref 65–99)

## 2015-03-11 LAB — VANCOMYCIN, TROUGH: VANCOMYCIN TR: 8 ug/mL — AB (ref 10.0–20.0)

## 2015-03-11 MED ORDER — PREMIER PROTEIN SHAKE
11.0000 [oz_av] | Freq: Two times a day (BID) | ORAL | Status: DC
  Administered 2015-03-12 – 2015-03-14 (×6): 11 [oz_av] via ORAL
  Filled 2015-03-11 (×5): qty 325.31

## 2015-03-11 MED ORDER — VANCOMYCIN HCL IN DEXTROSE 750-5 MG/150ML-% IV SOLN
750.0000 mg | Freq: Two times a day (BID) | INTRAVENOUS | Status: DC
  Administered 2015-03-11 – 2015-03-12 (×4): 750 mg via INTRAVENOUS
  Filled 2015-03-11 (×5): qty 150

## 2015-03-11 NOTE — Progress Notes (Signed)
Nutrition Follow-up  DOCUMENTATION CODES:   Not applicable  INTERVENTION:  - Continue Premier Protein and will increase to BID - RD will continue to monitor for needs  NUTRITION DIAGNOSIS:   Increased nutrient needs related to wound healing as evidenced by estimated needs. -ongoing  GOAL:   Patient will meet greater than or equal to 90% of their needs -meeting on average  MONITOR:   PO intake, Supplement acceptance, Weight trends, Labs, Skin, I & O's  ASSESSMENT:   74 y.o. 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  12/26 Nutrition needs slightly adjusted given updated wound status documentation. Per chart review, pt has been eating 75-100% since initial RD assessment. Unable to do physical assessment at this time with respect for pt comfort as she was sleeping at time of visit.   Spoke with pt's brother and sister-in-law who were at bedside. They report that pt continues to have a good appetite and that she opens her mouth every time that she sees food. They also report that pt has been drinking Premier Protein; will increase to BID to further help with wound healing and to act as a snack for pt as she likes this supplement.   Family denies having any nutrition-related questions or concerns at this time. Needs met on average with meals and supplement. Medications reviewed. Labs reviewed; CBGs: 95-224 mg/dL.    12/23 - Pt is nonverbal mainly and is cared for by her brother and sister-in-law, who were in the room at time of RD visit; pt was out of the room to MRI.  - All information provided by these family members. - PTA and currently pt had/has a very good appetite.  - Chart review indicates she ate 80% of breakfast this AM. - Pt had previously been able to feed herself but for the past 1 year family has fed her to do changes in medical status. - Pt is able to chew  and swallow well as long as foods are cut into small pieces and are tender; she does not have any teeth and is not a candidate for dentures.  - Family has been providing pt with high protein foods to assist with wound healing.  - They also provided her with Premier Protein shake once/day or BID. - Family states that several different treatments have been occuring for wound/wound healing but most recent treatment made wound worse  - No wound care notes are available but RD will continue to monitor for documentation concerning wound and determine if estimated needs need adjustment.  - Family reports that despite good appetite PTA, pt lost 30-40 lbs over the past 1 year due to thyroid issues.  - Chart review indicates pt lost 39 lbs (22% body weight) in the past 11 months which is significant for time frame.   Diet Order:  Diet regular Room service appropriate?: Yes; Fluid consistency:: Thin  Skin:  Wound (see comment) (Stage 3 pressure ulcer to sacrum, Stage 2 pressure ulcer to L buttocks, DM ulcer to coccyx)  Last BM:  12/26  Height:   Ht Readings from Last 1 Encounters:  03/08/15 5' 6"  (1.676 m)    Weight:   Wt Readings from Last 1 Encounters:  03/08/15 139 lb 1.8 oz (63.1 kg)    Ideal Body Weight:  59.09 kg (kg)  BMI:  Body mass index is 22.46 kg/(m^2).  Estimated Nutritional Needs:   Kcal:  1700-1900  Protein:  80-90 grams  Fluid:  2.2 L/day  EDUCATION NEEDS:   No education needs identified at this time     Jarome Matin, RD, LDN Inpatient Clinical Dietitian Pager # [phone] After hours/weekend pager # [phone]

## 2015-03-11 NOTE — Progress Notes (Signed)
[facility] PROGRESS NOTE  Crystal Robinson [id number]:[id number] DOB: September 03, 1940 DOA: 03/07/2015 PCP: Marletta Lor, NP  Assessment/Plan: Principal Problem:   Cellulitis - continue antibiotic therapy - wound care nurse on board - General surgery consulted, appreciate their recommendations. Plans will be to obtain ultrasound of area to assess for fluid collection. Ultrasound order in place  Active Problems:   Mental retardation   Essential hypertension   Controlled type 2 diabetes mellitus without complication, without long-term current use of insulin (HCC) - Currently not well controlled will continue glipizide and sliding scale insulin. If blood sugars not improved then we'll plan on increasing sliding scale insulin regimen - Blood sugars elevated most likely elevated secondary to active infection   DVT (deep venous thrombosis) (HCC) - Plan will be to hold xarelto for the next 2-3 days due to possibility of surgical vs percutaneous drainage if fluid collection is evident.   Pressure ulcer of back - Consult general surgery on board   Hypercalcemia - Resolved with IV fluid rehydration   Protein-calorie malnutrition, moderate (HCC) - Continue nutritional supplementation  Code Status: DO NOT RESUSCITATE Family Communication: Discussed with family Disposition Plan: Pending further work up will need ultrasound to assess for definitive treatment recommendations.   Consultants:  General surgery  Procedures:  None  Antibiotics:  Zosyn and Vancomycin  HPI/Subjective: No new complaints placed. Patient is more alert per family.  Objective: Filed Vitals:   03/11/15 1133 03/11/15 1306  BP: 156/73 142/83  Pulse:  91  Temp:  100.1 F (37.8 C)  Resp:  1    Intake/Output Summary (Last 24 hours) at 03/11/15 1332 Last data filed at 03/11/15 1128  Gross per 24 hour  Intake  832.6 ml  Output      0 ml  Net  832.6 ml   Filed Weights   03/07/15 2134 03/08/15 0119  Weight: 72.576  kg (160 lb) 63.1 kg (139 lb 1.8 oz)    Exam:   General:  Pt in nad, alert and awake  Cardiovascular: rrr, no mrg  Respiratory: cta bl, no wheezes  Abdomen: soft, nd, nt  Musculoskeletal: no cyanosis on limited exam at upper extremities   Data Reviewed: Basic Metabolic Panel:  Recent Labs Lab 03/07/15 1944 03/07/15 1949 03/08/15 0443  NA 143 143 145  K 3.5 3.5 3.5  CL 104 106 110  CO2  --  27 28  GLUCOSE 323* 330* 278*  BUN CREATININE 0.40* 0.58 0.63  CALCIUM  --  10.4* 10.1   Liver Function Tests:  Recent Labs Lab 03/07/15 1949  AST 15  ALT 10*  ALKPHOS 67  BILITOT 0.7  PROT 7.7  ALBUMIN 3.2*   No results for input(s): LIPASE, AMYLASE in the last 168 hours. No results for input(s): AMMONIA in the last 168 hours. CBC:  Recent Labs Lab 03/07/15 1949 03/08/15 0443 03/09/15 0412 03/10/15 0430 03/11/15 0922  WBC 12.2* 10.9* 11.7* 9.8 11.2*  NEUTROABS 9.6*  --   --   --   --   HGB 13.0 10.9* 10.6* 10.8* 11.7*  HCT 39.1 32.9* 32.0* 33.0* 35.7*  MCV 90.3 88.9 88.2 87.8 89.5  PLT 423* 382 367 381 460*   Cardiac Enzymes: No results for input(s): CKTOTAL, CKMB, CKMBINDEX, TROPONINI in the last 168 hours. BNP (last 3 results) No results for input(s): BNP in the last 8760 hours.  ProBNP (last 3 results) No results for input(s): PROBNP in the last 8760 hours.  CBG:  Recent Labs  Lab 03/10/15 1215 03/10/15 1736 03/10/15 2153 03/11/15 0742 03/11/15 1146  GLUCAP 143* 224* 218* 100* 160*    Recent Results (from the past 240 hour(s))  Blood Culture (routine x 2)     Status: None (Preliminary result)   Collection Time: 03/07/15  7:25 PM  Result Value Ref Range Status   Specimen Description BLOOD RIGHT WRIST  Final   Special Requests BOTTLES DRAWN AEROBIC AND ANAEROBIC 5CC EACH  Final   Culture   Final    NO GROWTH 4 DAYS [facility]formed [facility]    Report Status PENDING  Incomplete  Blood Culture (routine x 2)     Status: None  (Preliminary result)   Collection Time: 03/07/15  8:05 PM  Result Value Ref Range Status   Specimen Description BLOOD LEFT ANTECUBITAL  Final   Special Requests BOTTLES DRAWN AEROBIC AND ANAEROBIC 5CC EACH  Final   Culture   Final    NO GROWTH 4 DAYS Performed at [facility]    Report Status PENDING  Incomplete  Urine culture     Status: None   Collection Time: 03/07/15  8:37 PM  Result Value Ref Range Status   Specimen Description URINE, CATHETERIZED  Final   Special Requests NONE  Final   Culture   Final    NO GROWTH 2 DAYS [facility]formed [facility]    Report Status 03/09/2015 FINAL  Final     Studies: [place] Pelvis Limited  03/11/2015  CLINICAL DATA:  Sacral decubitus ulcer for 1 year. Evaluate for abscess. EXAM: LIMITED ULTRASOUND OF PELVIS TECHNIQUE: Limited transabdominal ultrasound examination of the pelvis was performed. COMPARISON:  Pelvic MRI 03/08/2015. FINDINGS: Sonographic evaluation of the soft tissues around the decubitus ulcer was performed. No focal fluid collections are observed. IMPRESSION: No focal fluid collections identified around the decubitus ulcer to suggest abscess. Electronically Signed   By: Carey BullocksWilliam  Veazey M.D.   On: 03/11/2015 12:07    Scheduled Meds: . collagenase   Topical Daily  . fenofibrate  160 mg Oral Daily  . glipiZIDE  5 mg Oral Daily  . insulin aspart  0-5 Units Subcutaneous QHS  . insulin aspart  0-9 Units Subcutaneous TID WC  . piperacillin-tazobactam (ZOSYN)  IV  3.375 g Intravenous 3 times per day  . protein supplement shake  11 oz Oral Daily  . ramipril  10 mg Oral Daily  . senna-docusate  1 tablet Oral QHS  . vancomycin  750 mg Intravenous BID   Continuous Infusions:    Time spent: > 35 minutes    Penny PiaVEGA, Soniya Ashraf  [facility] Pager [phone] If 7PM-7AM, please contact night-coverage at [email or web address], password [email or web address] 03/11/2015, 1:32 PM  LOS: 4 days

## 2015-03-11 NOTE — Progress Notes (Signed)
Pharmacy Antibiotic Follow-up Note  Crystal Robinson is a 74 y.o. year-old female admitted on 03/07/2015.  The patient is currently on day #4 of vancomycin/zosyn for sacral wound with possible abscess.  Plans to re-evaluate fluid collection today or tomorrow to determine need for procedure to drain.  Assessment/Plan:  Vancomycin trough subtherapeutic today (goal 10-2415mcg/ml for SSSI), change to 750mg  IV q12h  Continue zosyn 3.375gm IV q8h over 4h infusion  Await possible plans for drainage of abscess  Temp (24hrs), Avg:98.4 F (36.9 C), Min:97.8 F (36.6 C), Max:99.3 F (37.4 C)   Recent Labs Lab 03/07/15 1949 03/08/15 0443 03/09/15 0412 03/10/15 0430 03/11/15 0922  WBC 12.2* 10.9* 11.7* 9.8 11.2*    Recent Labs Lab 03/07/15 1944 03/07/15 1949 03/08/15 0443  CREATININE 0.40* 0.58 0.63   Estimated Creatinine Clearance: 59.5 mL/min (by C-G formula based on Cr of 0.63).    Allergies  Allergen Reactions  . Allevyn Adhesive [Wound Dressings] Rash    Antimicrobials this admission: 03/07/2015 >> vanc >> 03/07/2015 >> zosyn >>  Levels/dose changes this admission: 12/26 0930 VT = 928mcg/ml on 500mg  q12h (prior to 7th dose), change to 750mg  q12h  Microbiology results: 12/22 blood: NGTD 12/22 urine: NG  Thank you for allowing pharmacy to be a part of this patient's care.  Juliette Alcideustin Jerik Falletta, PharmD, BCPS.   Pager: [phone] 03/11/2015 10:36 AM

## 2015-03-11 NOTE — Progress Notes (Signed)
  Subjective: Pt with no acute changes.  Undergoing Santyl dressings  Objective: Vital signs in last 24 hours: Temp:  [97.8 F (36.6 C)-99.3 F (37.4 C)] 97.8 F (36.6 C) (12/26 0700) Pulse Rate:  [84-119] 84 (12/26 0700) Resp:  [16-20] 16 (12/26 0700) BP: (147-167)/(73-83) 156/73 mmHg (12/26 0700) SpO2:  [90 %-99 %] 99 % (12/26 0700) Last BM Date: 03/10/15  Intake/Output from previous day: 12/25 0701 - 12/26 0700 In: 562.6 [P.O.:240; IV Piggyback:322.6] Out: -  Intake/Output this shift:    Gen: Alert Skin: wound c/d/i, min erythema, no fluctuance palpated, base clean  Lab Results:   Recent Labs  03/09/15 0412 03/10/15 0430  WBC 11.7* 9.8  HGB 10.6* 10.8*  HCT 32.0* 33.0*  PLT 367 381   Anti-infectives: Anti-infectives    Start     Dose/Rate Route Frequency Ordered Stop   03/08/15 1000  vancomycin (VANCOCIN) 500 mg in sodium chloride 0.9 % 100 mL IVPB     500 mg 100 mL/hr over 60 Minutes Intravenous Every 12 hours 03/08/15 0525     03/08/15 0600  piperacillin-tazobactam (ZOSYN) IVPB 3.375 g     3.375 g 12.5 mL/hr over 240 Minutes Intravenous 3 times per day 03/08/15 0525     03/07/15 1930  vancomycin (VANCOCIN) IVPB 1000 mg/200 mL premix     1,000 mg 200 mL/hr over 60 Minutes Intravenous  Once 03/07/15 1905 03/07/15 2221   03/07/15 1915  piperacillin-tazobactam (ZOSYN) IVPB 3.375 g     3.375 g 100 mL/hr over 30 Minutes Intravenous  Once 03/07/15 1903 03/07/15 2118      Assessment/Plan:  1. Infected, stage III decub ulcer  -Con't abx  -Rec to hold Xarelto  -repeat today/tomorrow to see if fluid collection is present, would recommend image guided  percutaneous drainage if it is present   LOS: 4 days    Marigene Ehlersamirez Jr., Jed LimerickArmando 03/11/2015

## 2015-03-12 LAB — GLUCOSE, CAPILLARY
GLUCOSE-CAPILLARY: 259 mg/dL — AB (ref 65–99)
GLUCOSE-CAPILLARY: 133 mg/dL — AB (ref 65–99)
Glucose-Capillary: 105 mg/dL — ABNORMAL HIGH (ref 65–99)
Glucose-Capillary: 163 mg/dL — ABNORMAL HIGH (ref 65–99)

## 2015-03-12 LAB — CULTURE, BLOOD (ROUTINE X 2)
CULTURE: NO GROWTH
Culture: NO GROWTH

## 2015-03-12 LAB — CREATININE, SERUM
Creatinine, Ser: 0.51 mg/dL (ref 0.44–1.00)
GFR calc non Af Amer: >60 mL/min (ref >60)

## 2015-03-12 MED ORDER — RIVAROXABAN 20 MG PO TABS
20.0000 mg | ORAL_TABLET | Freq: Every day | ORAL | Status: DC
  Administered 2015-03-12 – 2015-03-13 (×2): 20 mg via ORAL
  Filled 2015-03-12 (×2): qty 1

## 2015-03-12 MED ORDER — FLUTICASONE PROPIONATE 50 MCG/ACT NA SUSP
2.0000 | Freq: Every day | NASAL | Status: DC
  Administered 2015-03-12 – 2015-03-13 (×2): 2 via NASAL
  Filled 2015-03-12: qty 16

## 2015-03-12 NOTE — Progress Notes (Signed)
[facility] PROGRESS NOTE  Crystal Robinson NFA:[id number] DOB: 07-Aug-1940 DOA: 03/07/2015 PCP: Marletta LorBarr, Julie, NP  Brief narrative: Patient is a 74 year old with history of MR nonverbal at baseline, DVT on xarelto, NIDDM and hypertension who is presenting with infected sacral decubitus ulcer. No abscess on ultrasound as such general surgery has signed off.  Assessment/Plan: Principal Problem:   Cellulitis - Patient had increasing WBC count as such will continue IV antibiotic therapy at this juncture. May consider narrowing antibiotic regimen once WBC trends down. - wound care nurse on board - General surgery consulted and has signed off once no evidence of abscess reported.  Active Problems:   Mental retardation   Essential hypertension   Controlled type 2 diabetes mellitus without complication, without long-term current use of insulin (HCC) - Relatively well controlled will continue glipizide and sliding scale insulin. If blood sugars not improved then we'll plan on increasing sliding scale insulin regimen - Blood sugars elevated most likely elevated secondary to active infection   DVT (deep venous thrombosis) (HCC) - Xarelto was held due to the possibility of surgical intervention. But will be continued now that we know that surgery intervention is not planned.   Hypercalcemia - Resolved with IV fluid rehydration   Protein-calorie malnutrition, moderate (HCC) - Continue nutritional supplementation  Code Status: DO NOT RESUSCITATE Family Communication: Discussed with family Disposition Plan: May consider discharging soon once patient is taking oral antibiotic regimen with improvement in condition   Consultants:  General surgery  Procedures:  None  Antibiotics:  Zosyn and Vancomycin  HPI/Subjective: Family reports patient is more alert.  Objective: Filed Vitals:   03/11/15 2228 03/12/15 0650  BP: 131/72 146/74  Pulse: 89 84  Temp: 99.6 F (37.6 C) 98.9 F (37.2  C)  Resp: 20 16    Intake/Output Summary (Last 24 hours) at 03/12/15 1117 Last data filed at 03/12/15 0900  Gross per 24 hour  Intake   1050 ml  Output      0 ml  Net   1050 ml   Filed Weights   03/07/15 2134 03/08/15 0119  Weight: 72.576 kg (160 lb) 63.1 kg (139 lb 1.8 oz)    Exam:   General:  Pt in nad, alert and awake  Cardiovascular: rrr, no mrg  Respiratory: cta bl, no wheezes  Abdomen: soft, nd, nt  Musculoskeletal: no cyanosis on limited exam at upper extremities   Data Reviewed: Basic Metabolic Panel:  Recent Labs Lab 03/07/15 1944 03/07/15 1949 03/08/15 0443 03/12/15 0536  NA 143 143 145  --   K 3.5 3.5 3.5  --   CL 104 106 110  --   CO2  --  27 28  --   GLUCOSE 323* 330* 278*  --   BUN 13 13 10   --   CREATININE 0.40* 0.58 0.63 0.51  CALCIUM  --  10.4* 10.1  --    Liver Function Tests:  Recent Labs Lab 03/07/15 1949  AST 15  ALT 10*  ALKPHOS 67  BILITOT 0.7  PROT 7.7  ALBUMIN 3.2*   No results for input(s): LIPASE, AMYLASE in the last 168 hours. No results for input(s): AMMONIA in the last 168 hours. CBC:  Recent Labs Lab 03/07/15 1949 03/08/15 0443 03/09/15 0412 03/10/15 0430 03/11/15 0922  WBC 12.2* 10.9* 11.7* 9.8 11.2*  NEUTROABS 9.6*  --   --   --   --   HGB 13.0 10.9* 10.6* 10.8* 11.7*  HCT 39.1 32.9* 32.0* 33.0* 35.7*  MCV 90.3 88.9 88.2 87.8 89.5  PLT 423* 382 367 381 460*   Cardiac Enzymes: No results for input(s): CKTOTAL, CKMB, CKMBINDEX, TROPONINI in the last 168 hours. BNP (last 3 results) No results for input(s): BNP in the last 8760 hours.  ProBNP (last 3 results) No results for input(s): PROBNP in the last 8760 hours.  CBG:  Recent Labs Lab 03/11/15 0742 03/11/15 1146 03/11/15 1708 03/11/15 2208 03/12/15 0746  GLUCAP 100* 160* 127* 148* 105*    Recent Results (from the past 240 hour(s))  Blood Culture (routine x 2)     Status: None   Collection Time: 03/07/15  7:25 PM  Result Value Ref Range  Status   Specimen Description BLOOD RIGHT WRIST  Final   Special Requests BOTTLES DRAWN AEROBIC AND ANAEROBIC 5CC EACH  Final   Culture   Final    NO GROWTH 5 DAYS Performed at [facility]    Report Status 03/12/2015 FINAL  Final  Blood Culture (routine x 2)     Status: None   Collection Time: 03/07/15  8:05 PM  Result Value Ref Range Status   Specimen Description BLOOD LEFT ANTECUBITAL  Final   Special Requests BOTTLES DRAWN AEROBIC AND ANAEROBIC 5CC EACH  Final   Culture   Final    NO GROWTH 5 DAYS Performed at [facility]    Report Status 03/12/2015 FINAL  Final  Urine culture     Status: None   Collection Time: 03/07/15  8:37 PM  Result Value Ref Range Status   Specimen Description URINE, CATHETERIZED  Final   Special Requests NONE  Final   Culture   Final    NO GROWTH 2 DAYS Performed at [facility]    Report Status 03/09/2015 FINAL  Final     Studies: US Pelvis Limited  03/11/2015  CLINICAL DATA:  Sacral decubitus ulcer for 1 year. Evaluate for abscess. EXAM: LIMITED ULTRASOUND OF PELVIS TECHNIQUE: Limited transabdominal ultrasound examination of the pelvis was performed. COMPARISON:  Pelvic MRI 03/08/2015. FINDINGS: Sonographic evaluation of the soft tissues around the decubitus ulcer was performed. No focal fluid collections are observed. IMPRESSION: No focal fluid collections identified around the decubitus ulcer to suggest abscess. Electronically Signed   By: Carey Bullocks M.D.   On: 03/11/2015 12:07    Scheduled Meds: . collagenase   Topical Daily  . fenofibrate  160 mg Oral Daily  . glipiZIDE  5 mg Oral Daily  . insulin aspart  0-5 Units Subcutaneous QHS  . insulin aspart  0-9 Units Subcutaneous TID WC  . piperacillin-tazobactam (ZOSYN)  IV  3.375 g Intravenous 3 times per day  . protein supplement shake  11 oz Oral BID BM  . ramipril  10 mg Oral Daily  . rivaroxaban  20 mg Oral Q supper  . senna-docusate  1 tablet Oral QHS  .  vancomycin  750 mg Intravenous BID   Continuous Infusions:    Time spent: > 35 minutes    Penny Pia  [facility] Pager [phone] If 7PM-7AM, please contact night-coverage at [email or web address], password [facility] 03/12/2015, 11:17 AM  LOS: 5 days

## 2015-03-12 NOTE — Progress Notes (Signed)
Patient ID: Crystal Robinson, female   DOB: 1940/07/08, 74 y.o.   MRN: [id number]    Subjective: Pt nonverbal.  NAD  Objective: Vital signs in last 24 hours: Temp:  [98.9 F (37.2 C)-100.1 F (37.8 C)] 98.9 F (37.2 C) (12/27 0650) Pulse Rate:  [84-91] 84 (12/27 0650) Resp:  [1-20] 16 (12/27 0650) BP: (131-156)/(72-83) 146/74 mmHg (12/27 0650) SpO2:  [96 %-99 %] 96 % (12/27 0650) Last BM Date: 03/10/15  Intake/Output from previous day: 12/26 0701 - 12/27 0700 In: 1170 [P.O.:720; IV Piggyback:450] Out: -  Intake/Output this shift:    PE: Skin: decubitus ulcer is mostly clean.  The superior rim with some necrotic tissue, but overall relatively clean.  No evidence of acute infection  Lab Results:   Recent Labs  03/10/15 0430 03/11/15 0922  WBC 9.8 11.2*  HGB 10.8* 11.7*  HCT 33.0* 35.7*  PLT 381 460*   BMET  Recent Labs  03/12/15 0536  CREATININE 0.51   PT/INR No results for input(s): LABPROT, INR in the last 72 hours. CMP     Component Value Date/Time   NA 145 03/08/2015 0443   K 3.5 03/08/2015 0443   CL 110 03/08/2015 0443   CO2 28 03/08/2015 0443   GLUCOSE 278* 03/08/2015 0443   BUN 10 03/08/2015 0443   CREATININE 0.51 03/12/2015 0536   CREATININE 0.64 07/01/2011 1532   CALCIUM 10.1 03/08/2015 0443   PROT 7.7 03/07/2015 1949   ALBUMIN 3.2* 03/07/2015 1949   AST 15 03/07/2015 1949   ALT 10* 03/07/2015 1949   ALKPHOS 67 03/07/2015 1949   BILITOT 0.7 03/07/2015 1949   GFRNONAA >60 03/12/2015 0536   GFRAA >60 03/12/2015 0536   Lipase  No results found for: LIPASE     Studies/Results: US Pelvis Limited  03/11/2015  CLINICAL DATA:  Sacral decubitus ulcer for 1 year. Evaluate for abscess. EXAM: LIMITED ULTRASOUND OF PELVIS TECHNIQUE: Limited transabdominal ultrasound examination of the pelvis was performed. COMPARISON:  Pelvic MRI 03/08/2015. FINDINGS: Sonographic evaluation of the soft tissues around the decubitus ulcer was performed. No focal fluid  collections are observed. IMPRESSION: No focal fluid collections identified around the decubitus ulcer to suggest abscess. Electronically Signed   By: Carey Bullocks M.D.   On: 03/11/2015 12:07    Anti-infectives: Anti-infectives    Start     Dose/Rate Route Frequency Ordered Stop   03/11/15 1130  vancomycin (VANCOCIN) IVPB 750 mg/150 ml premix     750 mg 150 mL/hr over 60 Minutes Intravenous 2 times daily 03/11/15 1036     03/08/15 1000  vancomycin (VANCOCIN) 500 mg in sodium chloride 0.9 % 100 mL IVPB  Status:  Discontinued     500 mg 100 mL/hr over 60 Minutes Intravenous Every 12 hours 03/08/15 0525 03/11/15 1036   03/08/15 0600  piperacillin-tazobactam (ZOSYN) IVPB 3.375 g     3.375 g 12.5 mL/hr over 240 Minutes Intravenous 3 times per day 03/08/15 0525     03/07/15 1930  vancomycin (VANCOCIN) IVPB 1000 mg/200 mL premix     1,000 mg 200 mL/hr over 60 Minutes Intravenous  Once 03/07/15 1905 03/07/15 2221   03/07/15 1915  piperacillin-tazobactam (ZOSYN) IVPB 3.375 g     3.375 g 100 mL/hr over 30 Minutes Intravenous  Once 03/07/15 1903 03/07/15 2118       Assessment/Plan  1. Stage 3 sacral decubitus ulcer Cont dressing changes BID and local wound care -no evidence of abscess -no plans for surgery, we will sign  off and [facility]   LOS: 5 days    OSBORNE,KELLY E 03/12/2015, 8:36 AM Pager: [phone]  Agree with above.  Ovidio Kinavid Asmaa Tirpak, MD, [facility] Pager: [phone] Office phone:  [phone]

## 2015-03-13 DIAGNOSIS — L89153 Pressure ulcer of sacral region, stage 3: Secondary | ICD-10-CM

## 2015-03-13 LAB — GLUCOSE, CAPILLARY
GLUCOSE-CAPILLARY: 128 mg/dL — AB (ref 65–99)
Glucose-Capillary: 88 mg/dL (ref 65–99)
Glucose-Capillary: 141 mg/dL — ABNORMAL HIGH (ref 65–99)

## 2015-03-13 LAB — CBC
HEMATOCRIT: 34 % — AB (ref 36.0–46.0)
HEMOGLOBIN: 11.1 g/dL — AB (ref 12.0–15.0)
MCH: 28.8 pg (ref 26.0–34.0)
MCHC: 32.6 g/dL (ref 30.0–36.0)
MCV: 88.3 fL (ref 78.0–100.0)
Platelets: 454 10*3/uL — ABNORMAL HIGH (ref 150–400)
RBC: 3.85 MIL/uL — ABNORMAL LOW (ref 3.87–5.11)
RDW: 12.8 % (ref 11.5–15.5)
WBC: 9.5 10*3/uL (ref 4.0–10.5)

## 2015-03-13 LAB — BASIC METABOLIC PANEL
Anion gap: 7 (ref 5–15)
BUN: 19 mg/dL (ref 6–20)
CALCIUM: 10.3 mg/dL (ref 8.9–10.3)
CHLORIDE: 109 mmol/L (ref 101–111)
CO2: 26 mmol/L (ref 22–32)
CREATININE: 0.66 mg/dL (ref 0.44–1.00)
GFR calc non Af Amer: >60 mL/min (ref >60)
GLUCOSE: 126 mg/dL — AB (ref 65–99)
Potassium: 3.7 mmol/L (ref 3.5–5.1)
Sodium: 142 mmol/L (ref 135–145)

## 2015-03-13 MED ORDER — AMOXICILLIN-POT CLAVULANATE 875-125 MG PO TABS
1.0000 | ORAL_TABLET | Freq: Two times a day (BID) | ORAL | Status: DC
  Administered 2015-03-13 – 2015-03-14 (×3): 1 via ORAL
  Filled 2015-03-13 (×3): qty 1

## 2015-03-13 MED ORDER — DOXYCYCLINE HYCLATE 100 MG PO TABS
100.0000 mg | ORAL_TABLET | Freq: Two times a day (BID) | ORAL | Status: DC
  Administered 2015-03-13 – 2015-03-14 (×3): 100 mg via ORAL
  Filled 2015-03-13 (×3): qty 1

## 2015-03-13 NOTE — Care Management Important Message (Signed)
Important Message  Patient Details  Name: Crystal Robinson Shepler MRN: [id number] Date of Birth: [birth year]   Medicare Important Message Given:  Yes    Haskell FlirtJamison, Meckenzie Balsley 03/13/2015, 12:01 PMImportant Message  Patient Details  Name: Crystal Robinson Krysiak MRN: [id number] Date of Birth: [birth year]   Medicare Important Message Given:  Yes    Haskell FlirtJamison, Larkin Morelos 03/13/2015, 12:01 PM

## 2015-03-13 NOTE — Consult Note (Signed)
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  Seen x3 by Surgery in consultation and they have now signed off as the suspected abscess has not materialized.  WBCs are trending downward and discharge is expected in the next day or so. Nursing contacted this WOC Nurse as there are conflicting orders on the chart.  CCS MD requested NS three times daily, collagen[facility]. Measurement: Per last week Wound bed: Small amount of necrotic tissue remains, but 95% clean wound bed at this time. Drainage (amount, consistency, odor) Small amount serous Periwound: previously noted erythema is resolving/resolved Dressing procedure/placement/frequency: I will discontinue the enzymatic debriding agent (collagenase/Santyl) and leave the three times daily saline dressing change order to be followed post discharge at this time. WOC nursing team will not follow, but will remain available to this patient, the nursing and medical teams.  Please re-consult if needed. Thanks, Ladona MowLaurie Lillyn Wieczorek, MSN, RN, GNP, Hans EdenCWOCN, CWON-AP, FAAN  Pager# [phone]

## 2015-03-13 NOTE — Progress Notes (Signed)
Patient ID: Crystal Robinson, female   DOB: Dec 31, 1940, 74 y.o.   MRN: [id number] [facility] PROGRESS NOTE  Crystal Robinson OZH:[id number] DOB: 01/06/1941 DOA: 03/07/2015 PCP: Marletta Lor, NP  Brief narrative:    74 year old female with past medical history of MR and nonverbal at baseline, DVT on xarelto, NIDDM and hypertension who presented to [facility] ED with infected sacral decubitus ulcer. She has been seen by surgery adn WOC in cinsultation. She has been on vanco and zosyn.   Assessment/Plan:     Cellulitis / Sacral pressure ulcer stage 3 - Surgery has signed off - Seen by WOC - Will change abx from vanco and zosyn to Augmentin and doxycycline  - Dressing changes: three times daily saline dressing change   Active Problems:  Mental retardation - Stable    Essential hypertension - Continue ramipril   Controlled type 2 diabetes mellitus without complication, without long-term current use of insulin (HCC) - Continue glipizide and SSI - CBG's in past 24 hours: 163, 88, 125   DVT (deep venous thrombosis) (HCC) - On AC with xarelto     Hypercalcemia - Likely due to dehydration - Resolved with fluids   Protein-calorie malnutrition, moderate (HCC) - In the context of chronic illness  - Continue nutritional supplementation   DVT Prophylaxis  - On anticoagulation with xarelto   Code Status: DNR/DNI Family Communication:  Caregivers at the bedside  Disposition Plan: Home 12/29  IV access:  Peripheral IV  Procedures and diagnostic studies:    US Pelvis Limited 03/11/2015  No focal fluid collections identified around the decubitus ulcer to suggest abscess. Electronically Signed   By: Carey Bullocks M.D.   On: 03/11/2015 12:07   Medical Consultants:  Surgery   Other Consultants:  WOC  IAnti-Infectives:   Vanco and zosyn through 12/28 Augmentin and doxycycline from 03/13/2015 -->   [name], MD  [facility] Pager [phone]  Time spent in minutes:  25 minutes  If 7PM-7AM, please contact night-coverage [email or web address] Password [email or web address] 03/13/2015, 10:07 AM   LOS: 6 days    HPI/Subjective: No acute overnight events. No resp distress.   Objective: Filed Vitals:   03/12/15 0650 03/12/15 1355 03/12/15 2139 03/13/15 0544  BP: 146/74 136/79 129/77 132/65  Pulse: 84 88 90 82  Temp: 98.9 F (37.2 C) 98.7 F (37.1 C) 99.4 F (37.4 C) 99.2 F (37.3 C)  TempSrc: Axillary Oral Axillary Axillary  Resp: Height:      Weight:      SpO2: 96% 97% 100% 99%    Intake/Output Summary (Last 24 hours) at 03/13/15 1007 Last data filed at 03/12/15 1700  Gross per 24 hour  Intake    600 ml  Output      0 ml  Net    600 ml    Exam:   General:  Pt is alert, not in acute distress  Cardiovascular: Regular rate and rhythm, S1/S2 (+)  Respiratory: Clear to auscultation bilaterally, no wheezing, no crackles, no rhonchi  Abdomen: Soft, non tender, non distended, bowel sounds present  Extremities: No edema, pulses DP and PT palpable bilaterally  Neuro: Grossly nonfocal  Data Reviewed: Basic Metabolic Panel:  Recent Labs Lab 03/07/15 1944 03/07/15 1949 03/08/15 0443 03/12/15 0536 03/13/15 0400  NA 143 143 145  --  142  K 3.5 3.5 3.5  --  3.7  CL 104 106 110  --  109  CO2  --  27 28  --  26  GLUCOSE 323* 330* 278*  --  126*  BUN 13 13 10   --  19  CREATININE 0.40* 0.58 0.63 0.51 0.66  CALCIUM  --  10.4* 10.1  --  10.3   Liver Function Tests:  Recent Labs Lab 03/07/15 1949  AST 15  ALT 10*  ALKPHOS 67  BILITOT 0.7  PROT 7.7  ALBUMIN 3.2*   No results for input(s): LIPASE, AMYLASE in the last 168 hours. No results for input(s): AMMONIA in the last 168 hours. CBC:  Recent Labs Lab 03/07/15 1949 03/08/15 0443 03/09/15 0412 03/10/15 0430 03/11/15 0922 03/13/15 0400  WBC 12.2* 10.9* 11.7* 9.8 11.2* 9.5  NEUTROABS 9.6*  --   --   --   --   --   HGB 13.0 10.9* 10.6* 10.8* 11.7* 11.1*  HCT 39.1 32.9*  32.0* 33.0* 35.7* 34.0*  MCV 90.3 88.9 88.2 87.8 89.5 88.3  PLT 423* 382 367 381 460* 454*   Cardiac Enzymes: No results for input(s): CKTOTAL, CKMB, CKMBINDEX, TROPONINI in the last 168 hours. BNP: Invalid input(s): POCBNP CBG:  Recent Labs Lab 03/12/15 0746 03/12/15 1126 03/12/15 1718 03/12/15 2127 03/13/15 0816  GLUCAP 105* 259* 133* 163* 88    Recent Results (from the past 240 hour(s))  Blood Culture (routine x 2)     Status: None   Collection Time: 03/07/15  7:25 PM  Result Value Ref Range Status   Specimen Description BLOOD RIGHT WRIST  Final   Culture   Final    NO GROWTH 5 DAYS [facility]    Report Status 03/12/2015 FINAL  Final  Blood Culture (routine x 2)     Status: None   Collection Time: 03/07/15  8:05 PM  Result Value Ref Range Status   Specimen Description BLOOD LEFT ANTECUBITAL  Final   Culture   Final    NO GROWTH 5 DAYS [facility]    Report Status 03/12/2015 FINAL  Final  Urine culture     Status: None   Collection Time: 03/07/15  8:37 PM  Result Value Ref Range Status   Specimen Description URINE, CATHETERIZED  Final   Special Requests NONE  Final   Culture   Final    NO GROWTH 2 DAYS [facility]formed at [facility]    Report Status 03/09/2015 FINAL  Final     Scheduled Meds: . amoxicillin-clavulanate  1 tablet Oral Q12H  . doxycycline  100 mg Oral Q12H  . fenofibrate  160 mg Oral Daily  . fluticasone  2 spray Each Nare QHS  . glipiZIDE  5 mg Oral Daily  . insulin aspart  0-5 Units Subcutaneous QHS  . insulin aspart  0-9 Units Subcutaneous TID WC  . protein supplement shake  11 oz Oral BID BM  . ramipril  10 mg Oral Daily  . rivaroxaban  20 mg Oral Q supper  . senna-docusate  1 tablet Oral QHS

## 2015-03-13 NOTE — Progress Notes (Signed)
CM continues to follow for DC needs. Pt [facility]. [facility]  Sandford Crazeora Pauleen Goleman RN,BSN,NCM [phone]

## 2015-03-14 LAB — GLUCOSE, CAPILLARY
GLUCOSE-CAPILLARY: 135 mg/dL — AB (ref 65–99)
Glucose-Capillary: 142 mg/dL — ABNORMAL HIGH (ref 65–99)

## 2015-03-14 MED ORDER — GLIPIZIDE ER 5 MG PO TB24: 5.0000 mg | ORAL_TABLET | Freq: Every day | ORAL | Status: AC

## 2015-03-14 MED ORDER — SENNOSIDES-DOCUSATE SODIUM 8.6-50 MG PO TABS: 1.0000 | ORAL_TABLET | Freq: Every day | ORAL | Status: AC

## 2015-03-14 MED ORDER — DOXYCYCLINE HYCLATE 100 MG PO TABS: 100.0000 mg | ORAL_TABLET | Freq: Two times a day (BID) | ORAL | Status: AC

## 2015-03-14 MED ORDER — FLUTICASONE PROPIONATE 50 MCG/ACT NA SUSP: 2.0000 | Freq: Every day | NASAL | Status: AC

## 2015-03-14 MED ORDER — AMOXICILLIN-POT CLAVULANATE 875-125 MG PO TABS: 1.0000 | ORAL_TABLET | Freq: Two times a day (BID) | ORAL | Status: AC

## 2015-03-14 MED ORDER — PREMIER PROTEIN SHAKE: 11.0000 [oz_av] | Freq: Two times a day (BID) | ORAL | Status: AC

## 2015-03-14 NOTE — Plan of Care (Signed)
Problem: Skin Integrity: Goal: Skin integrity will improve Outcome: Progressing Sacral wound improving with santyl and TID wet to dry dressing changes.

## 2015-03-14 NOTE — Progress Notes (Signed)
Pt for discharge to home.   CSW received notification that pt needing ambulance transport home.  CSW confirmed address with pt family at bedside.  CSW arranged ambulance transport for pt to home.  No further social work needs identified at this time.  CSW signing off.   Loletta SpecterSuzanna Kidd, MSW, LCSW Clinical Social Work [phone]

## 2015-03-14 NOTE — Discharge Instructions (Signed)
Pressure Injury °A pressure injury, sometimes called a bedsore, is an injury to the skin and underlying tissue caused by pressure. Pressure on blood vessels causes decreased blood flow to the skin, which can eventually cause the skin tissue to die and break down into a wound. °Pressure injuries usually occur: °· Over bony parts of the body such as the tailbone, shoulders, elbows, hips, and heels. °· Under medical devices such as respiratory equipment, stockings, tubes, and splints. °Pressure injuries start as reddened areas on the skin and can lead to pain, muscle damage, and infection. Pressure injuries can vary in severity.  °CAUSES °Pressure injuries are caused by a lack of blood supply to an area of skin. They can occur from intense pressure over a short period of time or from less intense pressure over a long period of time. °RISK FACTORS °This condition is more likely to develop in people who: °· Are in the hospital or an extended care facility. °· Are bedridden or in a wheelchair. °· Have an injury or disease that keeps them from: °¨ Moving normally. °¨ Feeling pain or pressure. °· Have a condition that: °¨ Makes them sleepy or less alert. °¨ Causes poor blood flow. °· Need to wear a medical device. °· Have poor control of their bladder or bowel functions (incontinence). °· Have poor nutrition (malnutrition). °· Are of certain ethnicities. People of African American and Latino or Hispanic descent are at higher risk compared to other ethnic groups. °If you are at risk for pressure ulcers, your health care provider may recommend certain types of bedding to help prevent them. These may include foam or gel mattresses covered with one of the following: °· A sheepskin blanket. °· A pad that is filled with gel, air, water, or foam. °SYMPTOMS  °The main symptom is a blister or change in skin color that opens into a wound. Other symptoms include:  °· Red or dark areas of skin that do not turn white or pale when  pressed with a finger.   °· Pain, warmth, or change of skin texture.   °DIAGNOSIS °This condition is diagnosed with a medical history and physical exam. You may also have tests, including:  °· Blood tests to check for infection or signs of poor nutrition. °· Imaging studies to check for damage to the deep tissues under your skin. °· Blood flow studies. °Your pressure injury will be staged to determine its severity. Staging is an assessment of: °· The depth of the pressure injury. °· Which tissues are exposed because of the pressure injury. °· The causes of the pressure injury.  °TREATMENT °The main focus of treatment is to help your injury heal. This may be done by:  °· Relieving or redistributing pressure on your skin. This includes: °¨ Frequently changing your position. °¨ Eliminating or minimizing positions that caused the wound or that can make the wound worse. °¨ Using specific bed mattresses and chair cushions. °¨ Refitting, resizing, or replacing any medical devices, or padding the skin under them. °¨ Using creams or powders to prevent rubbing (friction) on the skin. °· Keeping your skin clean and dry. This may include using a skin cleanser or skin protectant as told by your health care provider. This may be a lotion, ointment, or spray. °· Cleaning your injury and removing any dead tissue from the wound (debridement). °· Placing a bandage (dressing) over your injury. °· Preventing or treating infection. This may include antibiotic, antimicrobial, or antiseptic medicines. °Treatment may also include medicine for pain. °  Sometimes surgery is needed to close the wound with a flap of healthy skin or a piece of skin from another area of your body (graft). You may need surgery if other treatments are not working or if your injury is very deep. °HOME CARE INSTRUCTIONS °Wound Care °· Follow instructions from your health care provider about: °¨ How to take care of your wound. °¨ When and how you should change your  dressing. °¨ When you should remove your dressing. If your dressing is dry and stuck when you try to remove it, moisten or wet the dressing with saline or water so that it can be removed without harming your skin or wound tissue. °· Check your wound every day for signs of infection. Have a caregiver do this for you if you are not able. Watch for: °¨ More redness, swelling, or pain. °¨ More fluid, blood, or pus. °¨ A bad smell. °Skin Care °· Keep your skin clean and dry. Gently pat your skin dry. °· Do not rub or massage your skin. °· Use a skin protectant only as told by your health care provider. °· Check your skin every day for any changes in color or any new blisters or sores (ulcers). Have a caregiver do this for you if you are not able. °Medicines  °· Take over-the-counter and prescription medicines only as told by your health care provider. °· If you were prescribed an antibiotic medicine, take it or apply it as told by your health care provider. Do not stop taking or using the antibiotic even if your condition improves. °Reducing and Redistributing Pressure °· Do not lie or sit in one position for a long time. Move or change position every two hours or as told by your health care provider. °· Use pillows or cushions to reduce pressure. Ask your health care provider to recommend cushions or pads for you. °· Use medical devices that do not rub your skin. Tell your health care provider if one of your medical devices is causing a pressure injury to develop. °General Instructions °· Eat a healthy diet that includes lots of protein. Ask your health care provider for diet advice. °· Drink enough fluid to keep your urine clear or pale yellow. °· Be as active as you can every day. Ask your health care provider to suggest safe exercises or activities. °· Do not abuse drugs or alcohol. °· Keep all follow-up visits as told by your health care provider. This is important. °· Do not smoke. °SEEK MEDICAL CARE IF: °· You  have chills or fever. °· Your pain medicine is not helping. °· You have any changes in skin color. °· You have new blisters or sores. °· You develop warmth, redness, or swelling near a pressure injury. °· You have a bad odor or pus coming from your pressure injury. °· You lose control of your bowels or bladder. °· You develop new symptoms. °· Your wound does not improve after 1-2 weeks of treatment. °· You develop a new medical condition, such as diabetes, peripheral vascular disease, or conditions that affect your defense (immune) system. °  °This information is not intended to replace advice given to you by your health care provider. Make sure you discuss any questions you have with your health care provider. °  °Document Released: 03/02/2005 Document Revised: 11/21/2014 Document Reviewed: 07/11/2014 °Elsevier Interactive Patient Education ©2016 [employer] ° °

## 2015-03-14 NOTE — Progress Notes (Signed)
Pt discharged via ambulance to home with family. Discharge instructions given to family and all questions answered. Dressing changed to sacrum at 530 this am. Next dressing change due at 2pm.

## 2015-03-14 NOTE — Discharge Summary (Signed)
Physician Discharge Summary  Crystal Robinson ZOX:[id number] DOB: December 30, 1940 DOA: 03/07/2015  PCP: Marletta Lor, NP  Admit date: 03/07/2015 Discharge date: 03/14/2015  Recommendations for Outpatient Follow-up:  1. Continue Augmentin and doxycycline for 7 days on discharge.    Discharge wound care:    Complete by:  As directed  Drainage (amount, consistency, odor) Small amount serous Periwound: previously noted erythema is resolving/resolved Dressing procedure/placement/frequency: Three times daily saline dressing change order to be followed post discharge at this time.       Discharge Diagnoses:  Principal Problem:   Cellulitis Active Problems:   Mental retardation   Essential hypertension   Controlled type 2 diabetes mellitus without complication, without long-term current use of insulin (HCC)   DVT (deep venous thrombosis) (HCC)   Pressure ulcer of back   Hypercalcemia   Protein-calorie malnutrition, moderate (HCC)    Discharge Condition: stable   Diet recommendation: as tolerated   History of present illness:  74 year old female with past medical history of MR and nonverbal at baseline, DVT on xarelto, NIDDM and hypertension who presented to [facility] ED with infected sacral decubitus ulcer. She has been seen by surgery adn WOC in cinsultation. She has been on vanco and zosyn.    Hospital Course:   Assessment/Plan:     Cellulitis / Sacral pressure ulcer stage 3 - Surgery has signed off - Seen by WOC - Was on vanco and zosyn through 12/28, then changed to Augmentin and doxycycline which she will continue for 7 days on discharge. - Dressing changes: three times daily saline dressing change   Active Problems:  Mental retardation - Stable    Essential hypertension - Continue ramipril   Controlled type 2 diabetes mellitus without complication, without long-term current use of insulin (HCC) - Continue glipizide on discharge    DVT (deep venous thrombosis) (HCC) -  Continue xarelto    Hypercalcemia - Likely due to dehydration - Resolved with hydration    Protein-calorie malnutrition, moderate (HCC) - In the context of chronic illness  - Continue nutritional supplementation   DVT Prophylaxis  - Continue anticoagulation with xarelto   Code Status: DNR/DNI Family Communication: Caregivers at the bedside    IV access:  Peripheral IV  Procedures and diagnostic studies:   US Pelvis Limited 03/11/2015 No focal fluid collections identified around the decubitus ulcer to suggest abscess. Electronically Signed By: Carey Bullocks M.D. On: 03/11/2015 12:07   Medical Consultants:  Surgery   Other Consultants:  WOC IAnti-Infectives:   Vanco and zosyn through 12/28 Augmentin and doxycycline from 03/13/2015 --> for 7 days on discharge   Signed:  [name], MD  [facility] 03/14/2015, 10:18 AM  Pager #: [phone]  Time spent in minutes: more than 30 minutes  Discharge Exam: Filed Vitals:   03/13/15 2056 03/14/15 0614  BP: 133/67 132/49  Pulse: 95 85  Temp: 98.3 F (36.8 C) 97.8 F (36.6 C)  Resp: 20 18   Filed Vitals:   03/13/15 0544 03/13/15 1525 03/13/15 2056 03/14/15 0614  BP: 132/65 131/71 133/67 132/49  Pulse: 82 101 95 85  Temp: 99.2 F (37.3 C) 98 F (36.7 C) 98.3 F (36.8 C) 97.8 F (36.6 C)  TempSrc: Axillary Axillary Axillary Axillary  Resp: Height:      Weight:      SpO2: 99% 97% 97% 97%    General: Pt is alert, not in acute distress Cardiovascular: Regular rate and rhythm, S1/S2 + Respiratory: Clear to auscultation bilaterally,  no wheezing, no crackles, no rhonchi Abdominal: Soft, non tender, non distended, bowel sounds +, no guarding Extremities: pulses palpable, no edema  Neuro: Grossly nonfocal  Discharge Instructions  Discharge Instructions    Call MD for:  difficulty breathing, headache or visual disturbances    Complete by:  As directed      Call  MD for:  persistant dizziness or light-headedness    Complete by:  As directed      Call MD for:  persistant nausea and vomiting    Complete by:  As directed      Call MD for:  severe uncontrolled pain    Complete by:  As directed      Diet - low sodium heart healthy    Complete by:  As directed      Discharge instructions    Complete by:  As directed   1. Continue Augmentin and doxycycline for 7 days on discharge.     Discharge wound care:    Complete by:  As directed   Drainage (amount, consistency, odor) Small amount serous Periwound: previously noted erythema is resolving/resolved Dressing procedure/placement/frequency: Three times daily saline dressing change order to be followed post discharge at this time.     Increase activity slowly    Complete by:  As directed             Medication List    TAKE these medications        acetaminophen 650 MG CR tablet  Commonly known as:  TYLENOL  Take 650 mg by mouth every 8 (eight) hours as needed for pain.     amoxicillin-clavulanate 875-125 MG tablet  Commonly known as:  AUGMENTIN  Take 1 tablet by mouth every 12 (twelve) hours.     ANASEPT ANTIMICROBIAL 0.057 % Gel  Generic drug:  Sodium Hypochlorite  Apply 1 application topically daily as needed (wound protection).     diphenhydramine-acetaminophen 25-500 MG Tabs tablet  Commonly known as:  TYLENOL PM  Take 1 tablet by mouth at bedtime.     docusate sodium 100 MG capsule  Commonly known as:  COLACE  Take 100 mg by mouth at bedtime.     doxycycline 100 MG tablet  Commonly known as:  VIBRA-TABS  Take 1 tablet (100 mg total) by mouth every 12 (twelve) hours.     fenofibrate 145 MG tablet  Commonly known as:  TRICOR  Take 145 mg by mouth daily.     fluticasone 50 MCG/ACT nasal spray  Commonly known as:  FLONASE  Place 2 sprays into both nostrils at bedtime.     Garlic 1000 MG Caps  Take 1,000 mg by mouth daily.     glipiZIDE 5 MG 24 hr tablet  Commonly known  as:  GLUCOTROL XL  Take 1 tablet (5 mg total) by mouth daily.     miconazole 2 % cream  Commonly known as:  MICOTIN  Apply 1 application topically daily as needed (wound protection).     MULTIVITAMIN & MINERAL PO  Take 1 tablet by mouth daily.     protective barrier Crea  Apply 1 application topically daily as needed (wound protection).     protein supplement shake Liqd  Commonly known as:  PREMIER PROTEIN  Take 325 mLs (11 oz total) by mouth 2 (two) times daily between meals.     ramipril 10 MG capsule  Commonly known as:  ALTACE  Take 1 capsule (10 mg total) by mouth daily.  rivaroxaban 20 MG Tabs tablet  Commonly known as:  XARELTO  Take 1 tablet (20 mg total) by mouth daily with supper.     senna-docusate 8.6-50 MG tablet  Commonly known as:  Senokot-S  Take 1 tablet by mouth at bedtime.     vitamin B-12 1000 MCG tablet  Commonly known as:  CYANOCOBALAMIN  Take 1,000 mcg by mouth daily.     vitamin C 500 MG tablet  Commonly known as:  ASCORBIC ACID  Take 500 mg by mouth daily.     Vitamin D3 2000 units Tabs  Take 1 tablet by mouth daily.     vitamin E 400 UNIT capsule  Take 400 Units by mouth daily.           Follow-up Information    Follow up with Marletta Lor, NP. Schedule an appointment as soon as possible for a visit in 1 week.   Specialty:  Nurse Practitioner   Why:  Follow up appt after recent hospitalization   Contact information:   [facility] [address] [place] [address] [phone]        The results of significant diagnostics from this hospitalization (including imaging, microbiology, ancillary and laboratory) are listed below for reference.    Significant Diagnostic Studies: Dg Lumbar Spine 2-3 Views  03/07/2015  CLINICAL DATA:  74 year old female with pressure ulcer in the lower back. Rule out osteomyelitis. EXAM: LUMBAR SPINE - 2-3 VIEW; DG HIP (WITH OR WITHOUT PELVIS) 2V BILAT COMPARISON:   Radiograph dated 04/19/2014 FINDINGS: There is osteopenia with multilevel diffuse degenerative changes of the spine. There is chronic appearing compression of the L5 vertebra. There is grade 1 L4-5 anterolisthesis. No acute fracture or dislocation. Right femoral intra medullary hardware. Constipation.  An IVC filter is noted. IMPRESSION: Osteopenia with multilevel degenerative changes of the spine. No acute fracture or dislocation. MRI or a white blood cell nuclear scan may provide better evaluation if osteomyelitis is clinically suspected. Electronically Signed   By: Elgie Collard M.D.   On: 03/07/2015 21:28   Mr Pelvis Wo Contrast  03/08/2015  CLINICAL DATA:  Sacral decubitus ulcer.  Evaluate for osteomyelitis. EXAM: MRI PELVIS WITHOUT CONTRAST TECHNIQUE: Multiplanar multisequence MR imaging of the pelvis was performed. No intravenous contrast was administered. COMPARISON:  None. FINDINGS: There is severe patient motion degrading image quality limiting evaluation. Bone/soft tissue There is a sacral decubitus ulcer with surrounding edema. There is no underlying focal marrow signal abnormality. There is a 5 x 2 x 5 cm fluid collection along the left inferolateral aspect of the sacrum. There is no other marrow step scratch at there is no marrow signal abnormality. Is no acute fracture, dislocation or avascular necrosis. There is no evidence of osteomyelitis of the sacrum. Alignment Normal. No subluxation. Dysplasia None. Joint effusion None. Cartilage Femoral cartilage: No focal chondral defect. Acetabular cartilage: No focal chondral defect. Capsule and ligaments Normal. Muscles and Tendons No focal abnormality.  Generalized muscle atrophy. Viscera Normal. No abnormality seen in pelvis. No lymphadenopathy. No free fluid in the pelvis. IMPRESSION: 1. Sacral decubitus ulcer with surrounding edema without evidence of osteomyelitis. 5 x 2 x 5 cm fluid collection along the left inferolateral aspect of the sacrum  concerning for abscess. Electronically Signed   By: Elige Ko   On: 03/08/2015 10:13   US Pelvis Limited  03/11/2015  CLINICAL DATA:  Sacral decubitus ulcer for 1 year. Evaluate for abscess. EXAM: LIMITED ULTRASOUND OF PELVIS TECHNIQUE:  Limited transabdominal ultrasound examination of the pelvis was performed. COMPARISON:  Pelvic MRI 03/08/2015. FINDINGS: Sonographic evaluation of the soft tissues around the decubitus ulcer was performed. No focal fluid collections are observed. IMPRESSION: No focal fluid collections identified around the decubitus ulcer to suggest abscess. Electronically Signed   By: Carey Bullocks M.D.   On: 03/11/2015 12:07   Dg Chest Port 1 View  03/07/2015  CLINICAL DATA:  Pt, from home, presents for sacral/buttock pressure ulcer wound check. EXAM: PORTABLE CHEST 1 VIEW COMPARISON:  04/18/2014 FINDINGS: Cardiac silhouette normal in size and configuration. No mediastinal or hilar masses or convincing adenopathy. Clear lungs. No pleural effusion or pneumothorax. Bony thorax is demineralized but grossly intact. IMPRESSION: No active disease. Electronically Signed   By: Amie Portland M.D.   On: 03/07/2015 20:00   Dg Hips Bilat With Pelvis 2v  03/07/2015  CLINICAL DATA:  74 year old female with pressure ulcer in the lower back. Rule out osteomyelitis. EXAM: LUMBAR SPINE - 2-3 VIEW; DG HIP (WITH OR WITHOUT PELVIS) 2V BILAT COMPARISON:  Radiograph dated 04/19/2014 FINDINGS: There is osteopenia with multilevel diffuse degenerative changes of the spine. There is chronic appearing compression of the L5 vertebra. There is grade 1 L4-5 anterolisthesis. No acute fracture or dislocation. Right femoral intra medullary hardware. Constipation.  An IVC filter is noted. IMPRESSION: Osteopenia with multilevel degenerative changes of the spine. No acute fracture or dislocation. MRI or a white blood cell nuclear scan may provide better evaluation if osteomyelitis is clinically suspected.  Electronically Signed   By: Elgie Collard M.D.   On: 03/07/2015 21:28    Microbiology: Recent Results (from the past 240 hour(s))  Blood Culture (routine x 2)     Status: None   Collection Time: 03/07/15  7:25 PM  Result Value Ref Range Status   Specimen Description BLOOD RIGHT WRIST  Final   Special Requests BOTTLES DRAWN AEROBIC AND ANAEROBIC 5CC EACH  Final   Culture   Final    NO GROWTH 5 DAYS Performed at [facility]    Report Status 03/12/2015 FINAL  Final  Blood Culture (routine x 2)     Status: None   Collection Time: 03/07/15  8:05 PM  Result Value Ref Range Status   Specimen Description BLOOD LEFT ANTECUBITAL  Final   Special Requests BOTTLES DRAWN AEROBIC AND ANAEROBIC 5CC EACH  Final   Culture   Final    NO GROWTH 5 DAYS Performed at [facility]    Report Status 03/12/2015 FINAL  Final  Urine culture     Status: None   Collection Time: 03/07/15  8:37 PM  Result Value Ref Range Status   Specimen Description URINE, CATHETERIZED  Final   Special Requests NONE  Final   Culture   Final    NO GROWTH 2 DAYS Performed at [facility]    Report Status 03/09/2015 FINAL  Final     Labs: Basic Metabolic Panel:  Recent Labs Lab 03/07/15 1944 03/07/15 1949 03/08/15 0443 03/12/15 0536 03/13/15 0400  NA 143 143 145  --  142  K 3.5 3.5 3.5  --  3.7  CL 104 106 110  --  109  CO2  --  27 28  --  26  GLUCOSE 323* 330* 278*  --  126*  BUN 13 13 10   --  19  CREATININE 0.40* 0.58 0.63 0.51 0.66  CALCIUM  --  10.4* 10.1  --  10.3   Liver Function  Tests:  Recent Labs Lab 03/07/15 1949  AST 15  ALT 10*  ALKPHOS 67  BILITOT 0.7  PROT 7.7  ALBUMIN 3.2*   No results for input(s): LIPASE, AMYLASE in the last 168 hours. No results for input(s): AMMONIA in the last 168 hours. CBC:  Recent Labs Lab 03/07/15 1949 03/08/15 0443 03/09/15 0412 03/10/15 0430 03/11/15 0922 03/13/15 0400  WBC 12.2* 10.9* 11.7* 9.8 11.2* 9.5  NEUTROABS  9.6*  --   --   --   --   --   HGB 13.0 10.9* 10.6* 10.8* 11.7* 11.1*  HCT 39.1 32.9* 32.0* 33.0* 35.7* 34.0*  MCV 90.3 88.9 88.2 87.8 89.5 88.3  PLT 423* 382 367 381 460* 454*   Cardiac Enzymes: No results for input(s): CKTOTAL, CKMB, CKMBINDEX, TROPONINI in the last 168 hours. BNP: BNP (last 3 results) No results for input(s): BNP in the last 8760 hours.  ProBNP (last 3 results) No results for input(s): PROBNP in the last 8760 hours.  CBG:  Recent Labs Lab 03/12/15 2127 03/13/15 0816 03/13/15 1246 03/13/15 1725 03/14/15 0752  GLUCAP 163* 88 128* 141* 142*

## 2015-03-15 LAB — GLUCOSE, CAPILLARY: Glucose-Capillary: 160 mg/dL — ABNORMAL HIGH (ref 65–99)

## 2015-05-15 DEATH — deceased

## 2015-05-22 ENCOUNTER — Telehealth: Payer: Self-pay | Admitting: *Deleted

## 2015-05-22 NOTE — Telephone Encounter (Signed)
Patient family member Roxy MannsLaura Nofziger called and states Elease Hashimotoatricia passed away on May 07, 2015

## 2016-12-17 IMAGING — CR DG CHEST 1V
1 series · 1 of 1 positions shown · non-contrast
Comparison: None.

CLINICAL DATA: Fall today from wheelchair. Leg injury. Mentally
handicapped. Initial encounter.

EXAM:
CHEST - 1 VIEW

[chest ap]
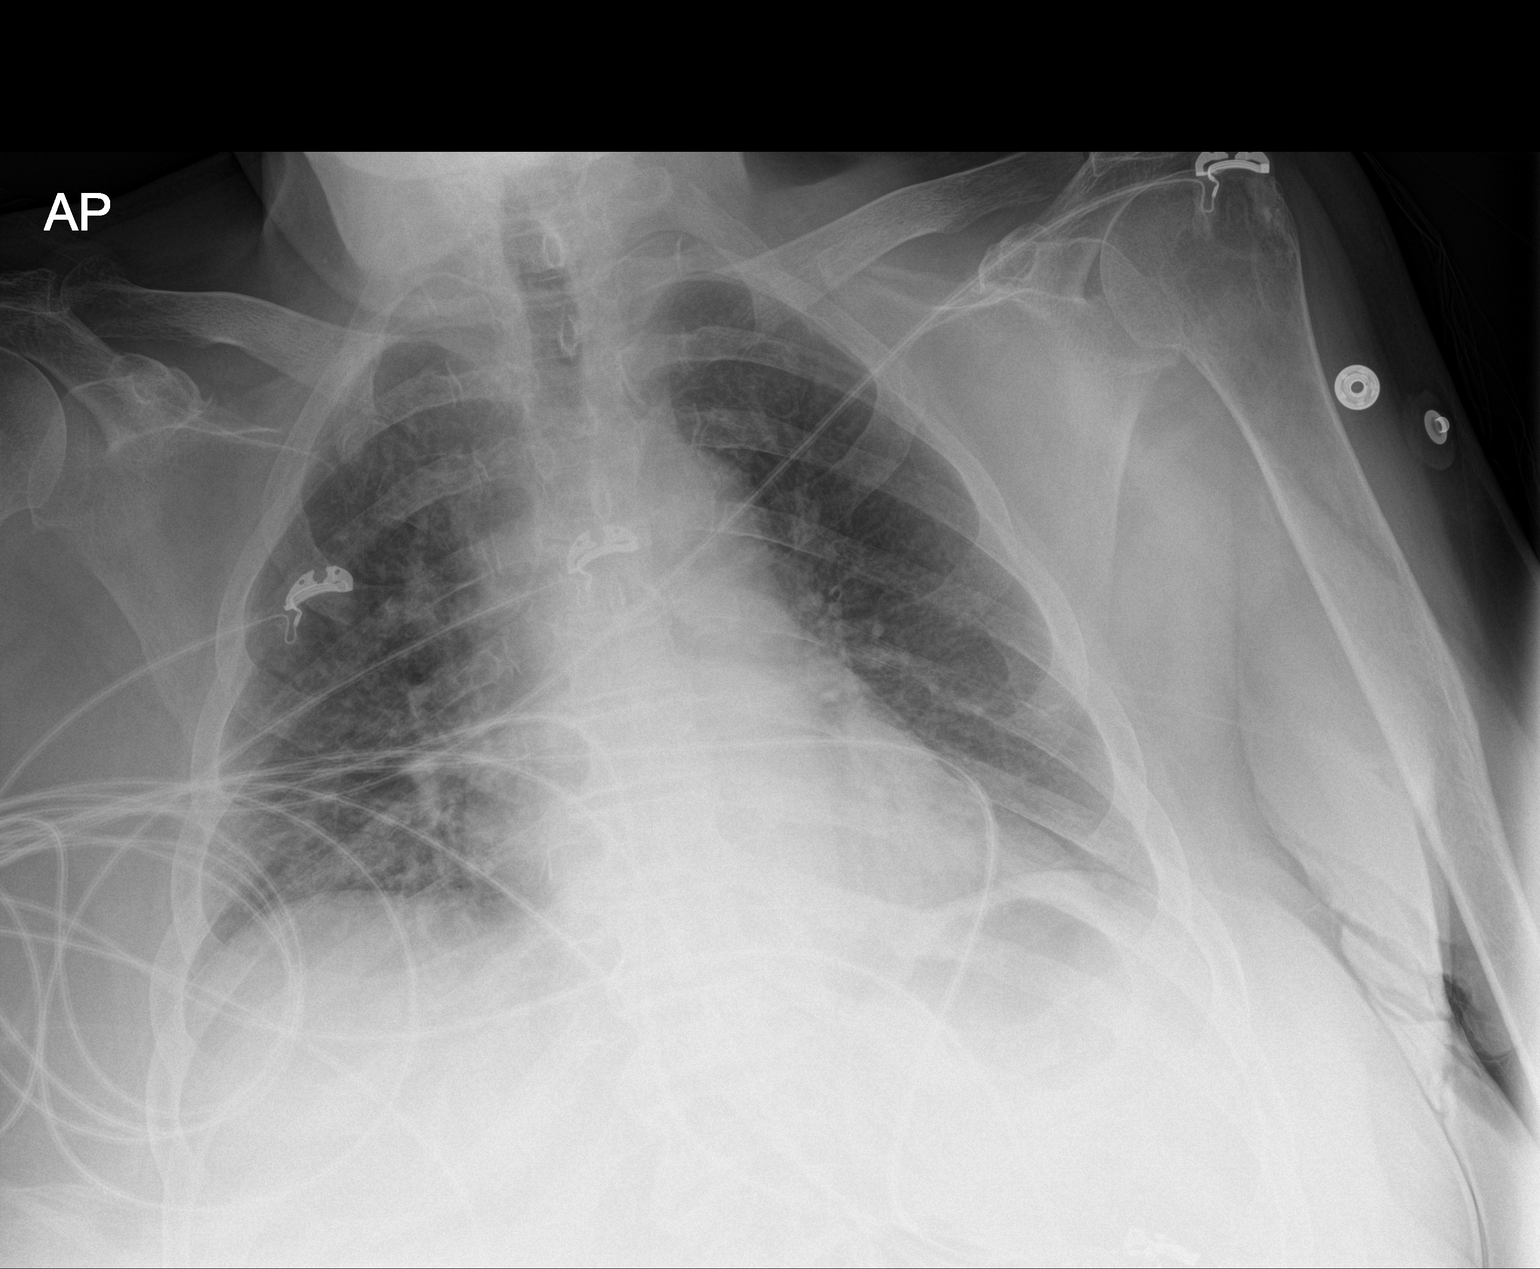

[1 of 1 positions shown; findings below may reference images not displayed]

FINDINGS: 3131 hr. There is mild patient rotation to the right. Allowing for
this, the heart size and mediastinal contours are normal There is
mild atelectasis at both lung bases, but no confluent airspace
opacity, pneumothorax or significant pleural effusion. No acute
fractures identified. Telemetry leads overlie the chest.
IMPRESSION: Mild bibasilar atelectasis.  No definite acute findings.

## 2016-12-18 IMAGING — RF DG FEMUR 2+V*R*
1 series · 7 of 7 positions shown · non-contrast
Comparison: Preoperative radiographs.

CLINICAL DATA: Right femur fracture.  IM nail.  Initial encounter.

EXAM:
RIGHT FEMUR 2 VIEWS; DG C-ARM 61-120 MIN

[Series 1: run · 7 of 7 slices shown]
[im 1/7]
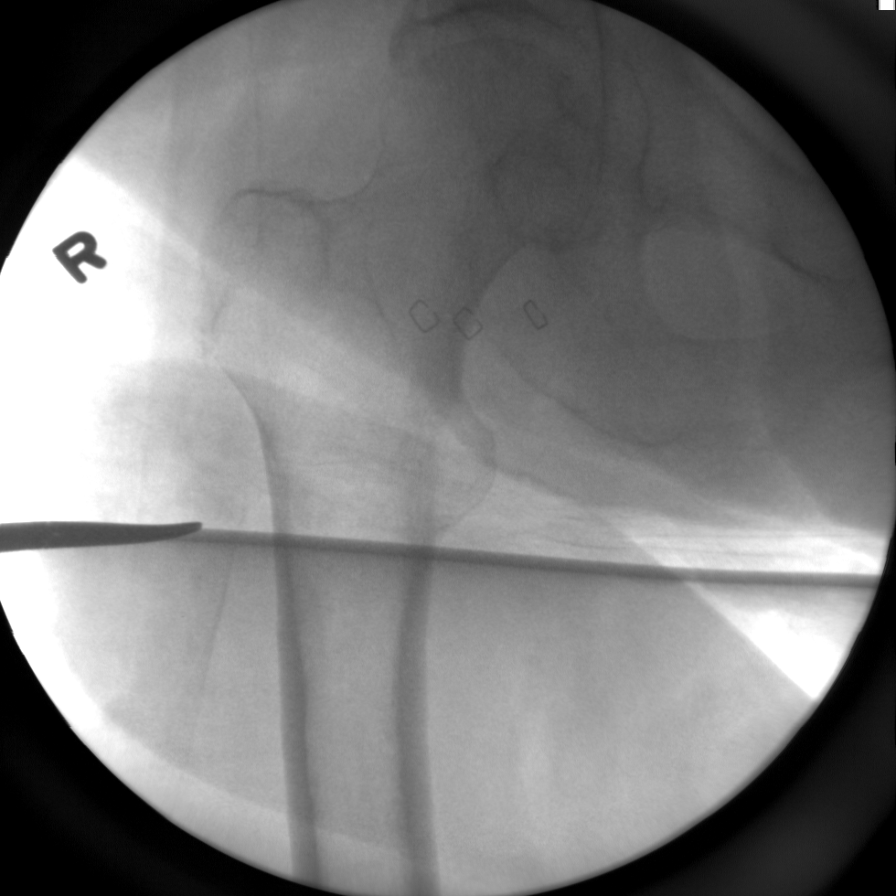
[im 2/7]
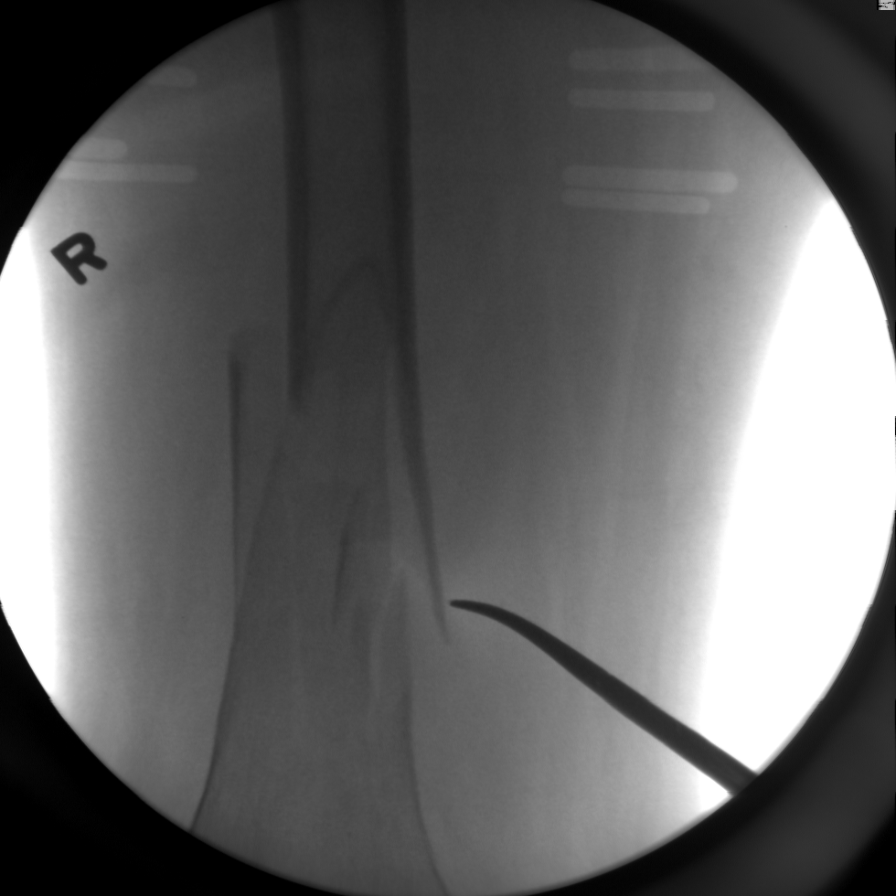
[im 3/7]
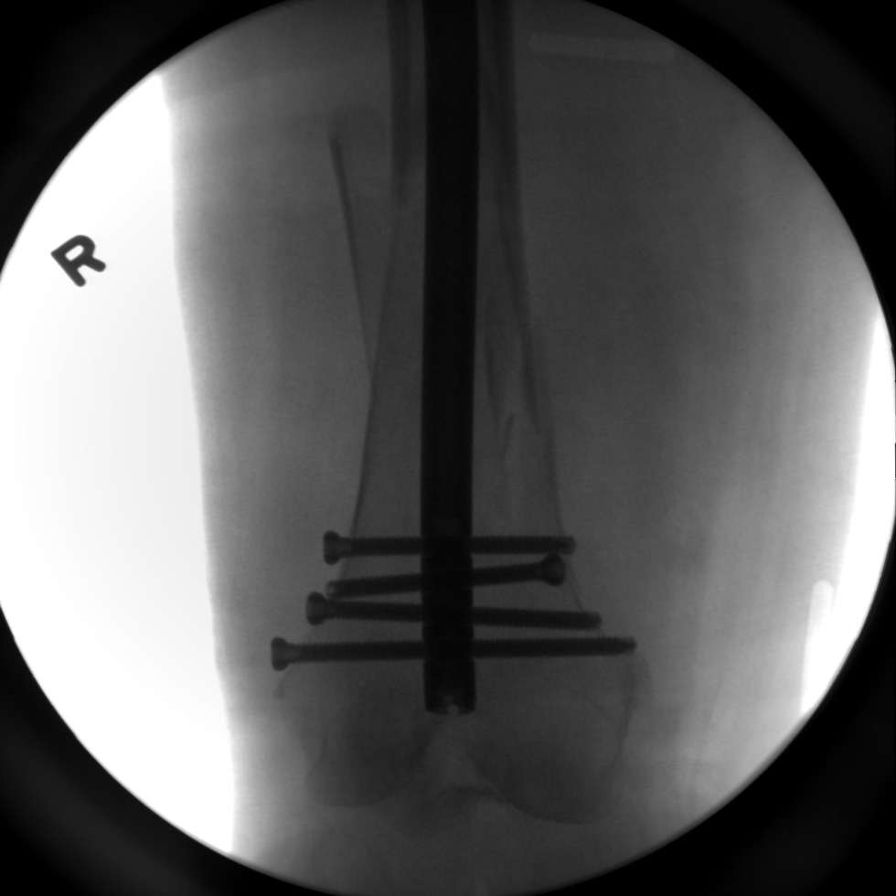
[im 4/7]
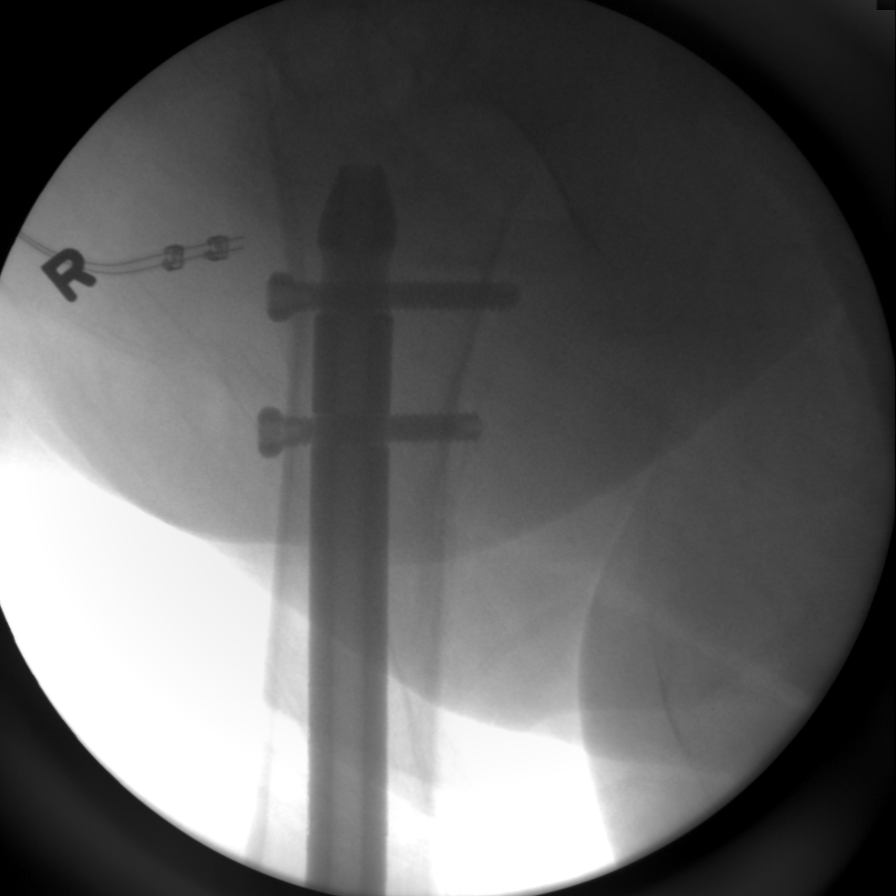
[im 5/7]
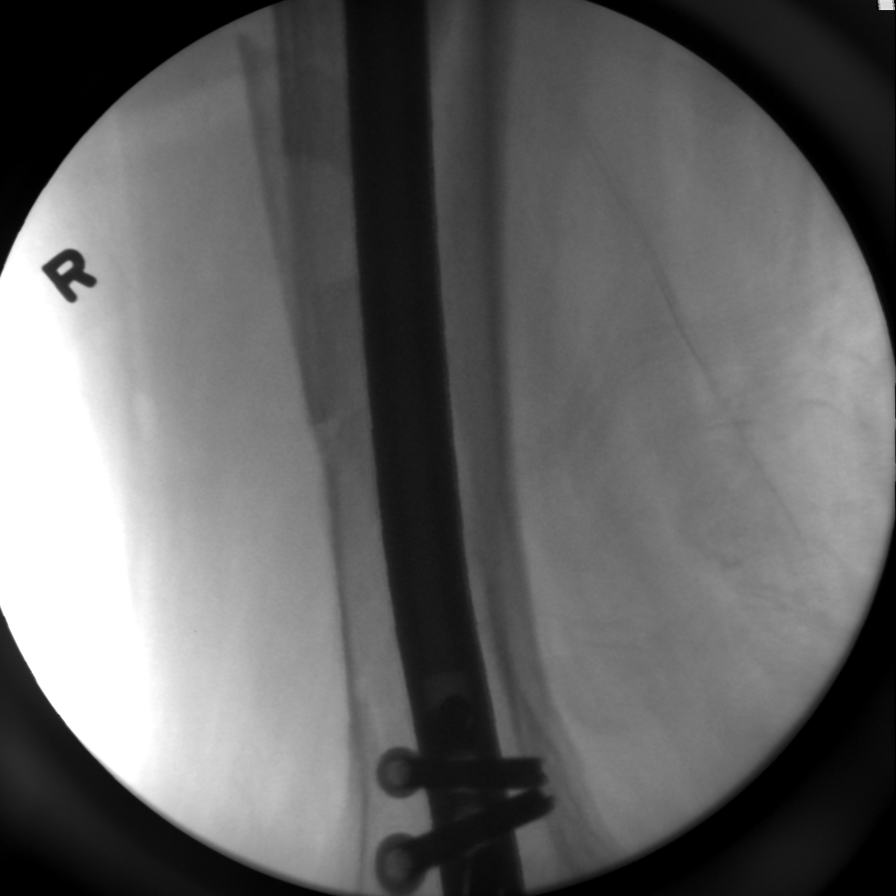
[im 6/7]
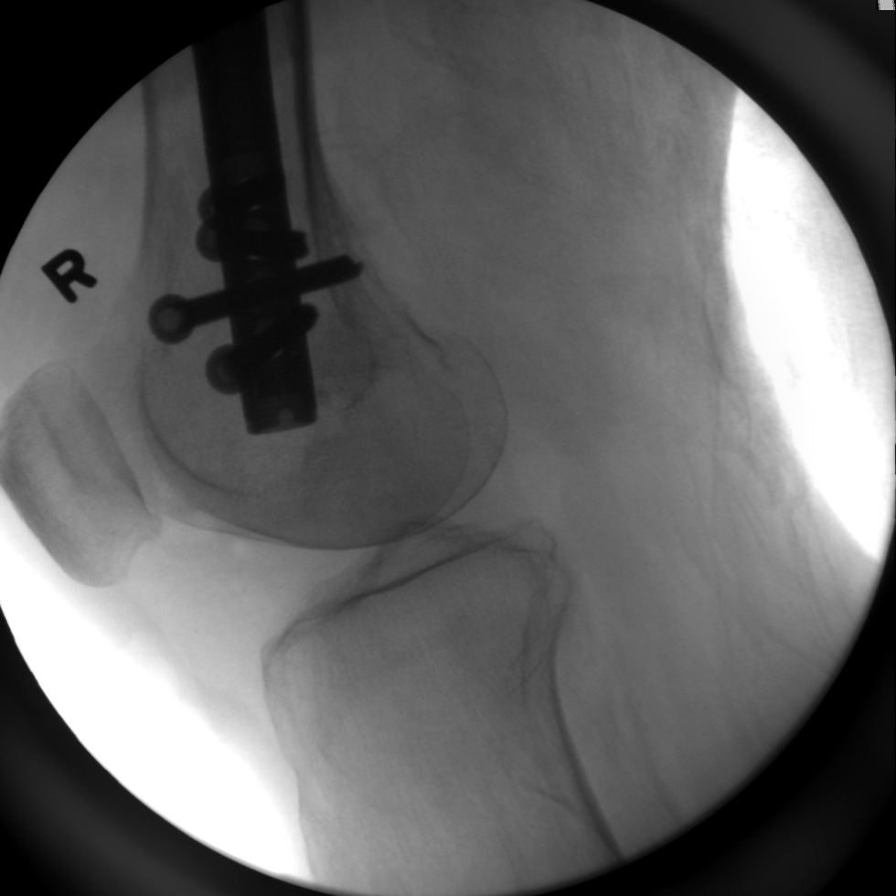
[im 7/7]
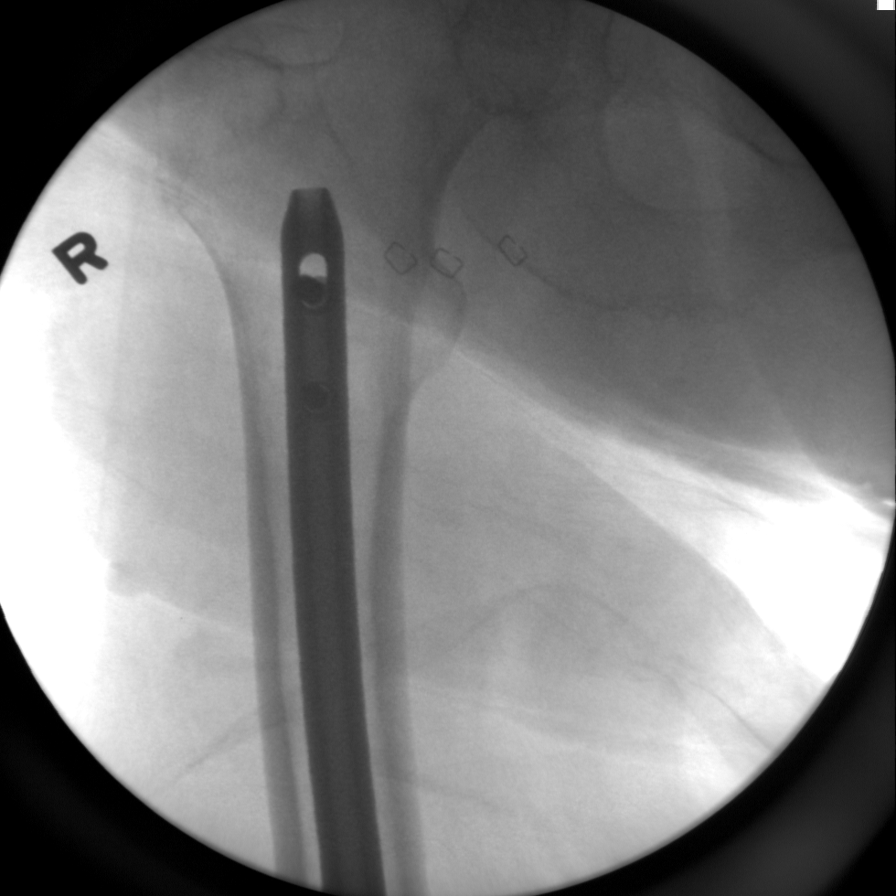

[7 of 7 positions shown; findings below may reference images not displayed]

FINDINGS: Seven intraoperative fluoroscopic spot images of the right femur in
frontal and lateral projections from the operating room during right
femur IM nail insertion demonstrate placement of intra medullary
nail with proximal and distal locking screws. There is improved
fracture alignment. Total fluoroscopy time 1 min 48 seconds.
IMPRESSION: Intraoperative fluoroscopy during right femur fracture IM nail.
